# Patient Record
Sex: Male | Born: 1940 | Race: White | Hispanic: No | Marital: Single | Smoking: Never smoker
Health system: Southern US, Community
[De-identification: ages and names within clinical notes are randomized; demographics above are authoritative.]

## PROBLEM LIST (undated history)

## (undated) DIAGNOSIS — J8410 Pulmonary fibrosis, unspecified: Secondary | ICD-10-CM

## (undated) DIAGNOSIS — I10 Essential (primary) hypertension: Secondary | ICD-10-CM

---

## 2009-08-16 ENCOUNTER — Ambulatory Visit: Payer: Self-pay | Admitting: Internal Medicine

## 2010-11-01 ENCOUNTER — Ambulatory Visit: Payer: Self-pay | Admitting: Unknown Physician Specialty

## 2015-03-22 DIAGNOSIS — R011 Cardiac murmur, unspecified: Secondary | ICD-10-CM | POA: Insufficient documentation

## 2015-07-27 DIAGNOSIS — C44111 Basal cell carcinoma of skin of unspecified eyelid, including canthus: Secondary | ICD-10-CM | POA: Insufficient documentation

## 2017-03-26 DIAGNOSIS — M5431 Sciatica, right side: Secondary | ICD-10-CM | POA: Insufficient documentation

## 2018-03-29 ENCOUNTER — Other Ambulatory Visit: Payer: Self-pay | Admitting: Internal Medicine

## 2018-03-29 DIAGNOSIS — R0602 Shortness of breath: Secondary | ICD-10-CM

## 2018-04-02 ENCOUNTER — Other Ambulatory Visit: Payer: Self-pay | Admitting: Specialist

## 2018-04-02 DIAGNOSIS — R0609 Other forms of dyspnea: Principal | ICD-10-CM

## 2018-04-02 DIAGNOSIS — J986 Disorders of diaphragm: Secondary | ICD-10-CM

## 2018-04-11 ENCOUNTER — Ambulatory Visit
Admission: RE | Admit: 2018-04-11 | Discharge: 2018-04-11 | Disposition: A | Discharge status: Home / Self Care | Payer: Medicare Other | Source: Ambulatory Visit | Attending: Specialist | Admitting: Specialist

## 2018-04-11 DIAGNOSIS — R0609 Other forms of dyspnea: Secondary | ICD-10-CM | POA: Insufficient documentation

## 2018-04-11 DIAGNOSIS — J986 Disorders of diaphragm: Secondary | ICD-10-CM | POA: Insufficient documentation

## 2018-04-11 DIAGNOSIS — R918 Other nonspecific abnormal finding of lung field: Secondary | ICD-10-CM | POA: Diagnosis not present

## 2018-04-25 ENCOUNTER — Ambulatory Visit
Admission: RE | Admit: 2018-04-25 | Discharge: 2018-04-25 | Disposition: A | Discharge status: Home / Self Care | Payer: Medicare Other | Source: Ambulatory Visit | Attending: Internal Medicine | Admitting: Internal Medicine

## 2018-04-25 DIAGNOSIS — R0602 Shortness of breath: Secondary | ICD-10-CM | POA: Insufficient documentation

## 2018-04-25 LAB — POCT I-STAT CREATININE: Creatinine, Ser: 1 mg/dL (ref 0.61–1.24)

## 2018-04-25 MED ORDER — IOHEXOL 300 MG/ML  SOLN
75.0000 mL | Freq: Once | INTRAMUSCULAR | Status: AC | PRN
  Administered 2018-04-25: 75 mL via INTRAVENOUS

## 2018-05-01 DIAGNOSIS — I2510 Atherosclerotic heart disease of native coronary artery without angina pectoris: Secondary | ICD-10-CM | POA: Insufficient documentation

## 2018-05-01 DIAGNOSIS — I350 Nonrheumatic aortic (valve) stenosis: Secondary | ICD-10-CM | POA: Insufficient documentation

## 2018-06-19 DIAGNOSIS — R768 Other specified abnormal immunological findings in serum: Secondary | ICD-10-CM | POA: Insufficient documentation

## 2018-12-18 DIAGNOSIS — M159 Polyosteoarthritis, unspecified: Secondary | ICD-10-CM | POA: Insufficient documentation

## 2018-12-24 ENCOUNTER — Other Ambulatory Visit: Payer: Self-pay | Admitting: Internal Medicine

## 2018-12-24 DIAGNOSIS — R2242 Localized swelling, mass and lump, left lower limb: Secondary | ICD-10-CM

## 2019-01-01 ENCOUNTER — Ambulatory Visit
Admission: RE | Admit: 2019-01-01 | Discharge: 2019-01-01 | Disposition: A | Discharge status: Home / Self Care | Payer: Medicare Other | Source: Ambulatory Visit | Attending: Internal Medicine | Admitting: Internal Medicine

## 2019-01-01 ENCOUNTER — Other Ambulatory Visit: Payer: Self-pay

## 2019-01-01 DIAGNOSIS — R2242 Localized swelling, mass and lump, left lower limb: Secondary | ICD-10-CM | POA: Diagnosis present

## 2019-01-06 ENCOUNTER — Other Ambulatory Visit: Payer: Self-pay | Admitting: Internal Medicine

## 2019-01-06 DIAGNOSIS — R2242 Localized swelling, mass and lump, left lower limb: Secondary | ICD-10-CM

## 2019-01-15 ENCOUNTER — Ambulatory Visit
Admission: RE | Admit: 2019-01-15 | Discharge: 2019-01-15 | Disposition: A | Discharge status: Home / Self Care | Payer: Medicare Other | Source: Ambulatory Visit | Attending: Internal Medicine | Admitting: Internal Medicine

## 2019-01-15 ENCOUNTER — Other Ambulatory Visit: Payer: Self-pay

## 2019-01-15 DIAGNOSIS — R2242 Localized swelling, mass and lump, left lower limb: Secondary | ICD-10-CM | POA: Insufficient documentation

## 2019-01-15 LAB — POCT I-STAT CREATININE: Creatinine, Ser: 1.1 mg/dL (ref 0.61–1.24)

## 2019-01-15 MED ORDER — GADOBUTROL 1 MMOL/ML IV SOLN
7.5000 mL | Freq: Once | INTRAVENOUS | Status: AC | PRN
  Administered 2019-01-15: 7.5 mL via INTRAVENOUS

## 2019-04-04 ENCOUNTER — Encounter: Payer: Medicare Other | Attending: Specialist | Admitting: *Deleted

## 2019-04-04 ENCOUNTER — Other Ambulatory Visit: Payer: Self-pay

## 2019-04-04 DIAGNOSIS — J8410 Pulmonary fibrosis, unspecified: Secondary | ICD-10-CM

## 2019-04-04 NOTE — Progress Notes (Signed)
Initial telephone orientation completed. Diagnosis completed 9/23. EP orientation scheduled for 11/17 at 9:30

## 2019-04-08 ENCOUNTER — Encounter: Payer: Medicare Other | Admitting: *Deleted

## 2019-04-08 ENCOUNTER — Other Ambulatory Visit: Payer: Self-pay

## 2019-04-08 VITALS — Ht 66.0 in | Wt 159.5 lb

## 2019-04-08 DIAGNOSIS — J8410 Pulmonary fibrosis, unspecified: Secondary | ICD-10-CM | POA: Diagnosis not present

## 2019-04-08 NOTE — Progress Notes (Signed)
Pulmonary Individual Treatment Plan  Patient Details  Name: Louis Gomez MRN: [id number] Date of Birth: Mar 22, 1941 Referring Provider:     Pulmonary Rehab from 04/08/2019 in [facility] Cardiac and Pulmonary Rehab  Referring Provider  Ancil Linsey MD      Initial Encounter Date:    Pulmonary Rehab from 04/08/2019 in [facility] Cardiac and Pulmonary Rehab  Date  04/08/19      Visit Diagnosis: Pulmonary fibrosis ([place])  Patient's Home Medications on Admission:  Current Outpatient Medications:  .  aspirin EC 81 MG tablet, Take by mouth., Disp: , Rfl:  .  atorvastatin (LIPITOR) 40 MG tablet, , Disp: , Rfl:  .  azelastine (ASTELIN) 0.1 % nasal spray, Place into the nose., Disp: , Rfl:  .  Multiple Vitamin (MULTIVITAMIN) capsule, Take by mouth., Disp: , Rfl:   Past Medical History: No past medical history on file.  Tobacco Use: Social History   Tobacco Use  Smoking Status Never Smoker  Smokeless Tobacco Current User  . Types: Chew    Labs: Recent Review Flowsheet Data    There is no flowsheet data to display.       Pulmonary Assessment Scores: Pulmonary Assessment Scores    Row Name 04/08/19 1318         ADL UCSD   ADL Phase  Entry     SOB Score total  17     Rest  0     Walk  1     Stairs  2     Bath  0     Dress  1     Shop  0       CAT Score   CAT Score  6       mMRC Score   mMRC Score  2        UCSD: Self-administered rating of dyspnea associated with activities of daily living (ADLs) 6-point scale (0 = "not at all" to 5 = "maximal or unable to do because of breathlessness")  Scoring Scores range from 0 to 120.  Minimally important difference is 5 units  CAT: CAT can identify the health impairment of COPD patients and is better correlated with disease progression.  CAT has a scoring range of zero to 40. The CAT score is classified into four groups of low (less than 10), medium (10 - 20), high (21-30) and very high (31-40) based on the impact level of  disease on health status. A CAT score over 10 suggests significant symptoms.  A worsening CAT score could be explained by an exacerbation, poor medication adherence, poor inhaler technique, or progression of COPD or comorbid conditions.  CAT MCID is 2 points  mMRC: mMRC (Modified Medical Research Council) Dyspnea Scale is used to assess the degree of baseline functional disability in patients of respiratory disease due to dyspnea. No minimal important difference is established. A decrease in score of 1 point or greater is considered a positive change.   Pulmonary Function Assessment:   Exercise Target Goals: Exercise Program Goal: Individual exercise prescription set using results from initial 6 min walk test and THRR while considering  patient's activity barriers and safety.   Exercise Prescription Goal: Initial exercise prescription builds to 30-45 minutes a day of aerobic activity, 2-3 days per week.  Home exercise guidelines will be given to patient during program as part of exercise prescription that the participant will acknowledge.  Activity Barriers & Risk Stratification: Activity Barriers & Cardiac Risk Stratification - 04/08/19 1303  Activity Barriers & Cardiac Risk Stratification   Activity Barriers  Deconditioning;Muscular Weakness;Balance Concerns;Shortness of Breath       6 Minute Walk: 6 Minute Walk    Row Name 04/08/19 1301         6 Minute Walk   Phase  Initial     Distance  1135 feet     Walk Time  6 minutes     # of Rest Breaks  0     MPH  2.15     METS  2.45     RPE  14     Perceived Dyspnea   3     VO2 Peak  8.58     Symptoms  Yes (comment)     Comments  SOB     Resting HR  63 bpm     Resting BP  122/64     Resting Oxygen Saturation   94 %     Exercise Oxygen Saturation  during 6 min walk  88 %     Max Ex. HR  103 bpm     Max Ex. BP  146/74     2 Minute Post BP  134/70       Interval HR   1 Minute HR  87     2 Minute HR  99     3 Minute  HR  77     4 Minute HR  62     5 Minute HR  63     6 Minute HR  103     2 Minute Post HR  67     Interval Heart Rate?  Yes       Interval Oxygen   Interval Oxygen?  Yes     Baseline Oxygen Saturation %  94 %     1 Minute Oxygen Saturation %  93 %     1 Minute Liters of Oxygen  0 L Room Air     2 Minute Oxygen Saturation %  92 %     2 Minute Liters of Oxygen  0 L     3 Minute Oxygen Saturation %  91 %     3 Minute Liters of Oxygen  0 L     4 Minute Oxygen Saturation %  97 %     4 Minute Liters of Oxygen  0 L     5 Minute Oxygen Saturation %  89 %     5 Minute Liters of Oxygen  0 L     6 Minute Oxygen Saturation %  88 %     6 Minute Liters of Oxygen  0 L     2 Minute Post Oxygen Saturation %  94 %     2 Minute Post Liters of Oxygen  0 L       Oxygen Initial Assessment: Oxygen Initial Assessment - 04/08/19 1317      Home Oxygen   Home Oxygen Device  None    Sleep Oxygen Prescription  None    Home Exercise Oxygen Prescription  None    Home at Rest Exercise Oxygen Prescription  None      Initial 6 min Walk   Oxygen Used  None      Program Oxygen Prescription   Program Oxygen Prescription  None      Intervention   Short Term Goals  To learn and understand importance of maintaining oxygen saturations>88%;To learn and demonstrate proper pursed lip breathing techniques or other breathing techniques.;To  learn and understand importance of monitoring SPO2 with pulse oximeter and demonstrate accurate use of the pulse oximeter.    Long  Term Goals  Maintenance of O2 saturations>88%;Verbalizes importance of monitoring SPO2 with pulse oximeter and return demonstration;Exhibits proper breathing techniques, such as pursed lip breathing or other method taught during program session       Oxygen Re-Evaluation:   Oxygen Discharge (Final Oxygen Re-Evaluation):   Initial Exercise Prescription: Initial Exercise Prescription - 04/08/19 1300      Date of Initial Exercise RX and  Referring Provider   Date  04/08/19    Referring Provider  Ancil Linsey MD      Treadmill   MPH  1.9    Grade  0.5    Minutes  15    METs  2.59      Recumbant Bike   Level  1    RPM  50    Watts  10    Minutes  15    METs  2.5      NuStep   Level  1    SPM  80    Minutes  15    METs  2.5      Recumbant Elliptical   Level  1    RPM  50    Minutes  15    METs  2.5      Prescription Details   Frequency (times per week)  3    Duration  Progress to 30 minutes of continuous aerobic without signs/symptoms of physical distress      Intensity   THRR 40-80% of Max Heartrate  95-127    Ratings of Perceived Exertion  11-13    Perceived Dyspnea  0-4      Progression   Progression  Continue to progress workloads to maintain intensity without signs/symptoms of physical distress.      Resistance Training   Training Prescription  Yes    Weight  3 lb    Reps  10-15       Perform Capillary Blood Glucose checks as needed.  Exercise Prescription Changes: Exercise Prescription Changes    Row Name 04/08/19 1300             Response to Exercise   Blood Pressure (Admit)  122/64       Blood Pressure (Exercise)  142/64       Blood Pressure (Exit)  134/70       Heart Rate (Admit)  63 bpm       Heart Rate (Exercise)  103 bpm       Heart Rate (Exit)  64 bpm       Oxygen Saturation (Admit)  94 %       Oxygen Saturation (Exercise)  88 %       Oxygen Saturation (Exit)  94 %       Rating of Perceived Exertion (Exercise)  14       Perceived Dyspnea (Exercise)  3       Symptoms  SOB       Comments  walk test results          Exercise Comments:   Exercise Goals and Review: Exercise Goals    Row Name 04/08/19 1312             Exercise Goals   Increase Physical Activity  Yes       Intervention  Provide advice, education, support and counseling about physical activity/exercise needs.;Develop an individualized exercise prescription  for aerobic and resistive training  based on initial evaluation findings, risk stratification, comorbidities and participant's personal goals.       Expected Outcomes  Short Term: Attend rehab on a regular basis to increase amount of physical activity.;Long Term: Exercising regularly at least 3-5 days a week.;Long Term: Add in home exercise to make exercise part of routine and to increase amount of physical activity.       Increase Strength and Stamina  Yes       Intervention  Provide advice, education, support and counseling about physical activity/exercise needs.;Develop an individualized exercise prescription for aerobic and resistive training based on initial evaluation findings, risk stratification, comorbidities and participant's personal goals.       Expected Outcomes  Short Term: Increase workloads from initial exercise prescription for resistance, speed, and METs.;Short Term: Perform resistance training exercises routinely during rehab and add in resistance training at home;Long Term: Improve cardiorespiratory fitness, muscular endurance and strength as measured by increased METs and functional capacity (6MWT)       Able to understand and use rate of perceived exertion (RPE) scale  Yes       Intervention  Provide education and explanation on how to use RPE scale       Expected Outcomes  Short Term: Able to use RPE daily in rehab to express subjective intensity level;Long Term:  Able to use RPE to guide intensity level when exercising independently       Able to understand and use Dyspnea scale  Yes       Intervention  Provide education and explanation on how to use Dyspnea scale       Expected Outcomes  Short Term: Able to use Dyspnea scale daily in rehab to express subjective sense of shortness of breath during exertion;Long Term: Able to use Dyspnea scale to guide intensity level when exercising independently       Knowledge and understanding of Target Heart Rate Range (THRR)  Yes       Intervention  Provide education and  explanation of THRR including how the numbers were predicted and where they are located for reference       Expected Outcomes  Short Term: Able to state/look up THRR;Long Term: Able to use THRR to govern intensity when exercising independently;Short Term: Able to use daily as guideline for intensity in rehab       Able to check pulse independently  Yes       Intervention  Provide education and demonstration on how to check pulse in carotid and radial arteries.;Review the importance of being able to check your own pulse for safety during independent exercise       Expected Outcomes  Short Term: Able to explain why pulse checking is important during independent exercise;Long Term: Able to check pulse independently and accurately       Understanding of Exercise Prescription  Yes       Intervention  Provide education, explanation, and written materials on patient's individual exercise prescription       Expected Outcomes  Short Term: Able to explain program exercise prescription;Long Term: Able to explain home exercise prescription to exercise independently          Exercise Goals Re-Evaluation :   Discharge Exercise Prescription (Final Exercise Prescription Changes): Exercise Prescription Changes - 04/08/19 1300      Response to Exercise   Blood Pressure (Admit)  122/64    Blood Pressure (Exercise)  142/64    Blood Pressure (Exit)  134/70    Heart Rate (Admit)  63 bpm    Heart Rate (Exercise)  103 bpm    Heart Rate (Exit)  64 bpm    Oxygen Saturation (Admit)  94 %    Oxygen Saturation (Exercise)  88 %    Oxygen Saturation (Exit)  94 %    Rating of Perceived Exertion (Exercise)  14    Perceived Dyspnea (Exercise)  3    Symptoms  SOB    Comments  walk test results       Nutrition:  Target Goals: Understanding of nutrition guidelines, daily intake of sodium '1500mg'$ , cholesterol '200mg'$ , calories 30% from fat and 7% or less from saturated fats, daily to have 5 or more servings of fruits  and vegetables.  Biometrics: Pre Biometrics - 04/08/19 1313      Pre Biometrics   Height  '5\' 6"'$  (1.676 m)    Weight  159 lb 8 oz (72.3 kg)    BMI (Calculated)  25.76    Single Leg Stand  1.63 seconds        Nutrition Therapy Plan and Nutrition Goals:   Nutrition Assessments: Nutrition Assessments - 04/08/19 1138      MEDFICTS Scores   Pre Score  95       Nutrition Goals Re-Evaluation:   Nutrition Goals Discharge (Final Nutrition Goals Re-Evaluation):   Psychosocial: Target Goals: Acknowledge presence or absence of significant depression and/or stress, maximize coping skills, provide positive support system. Participant is able to verbalize types and ability to use techniques and skills needed for reducing stress and depression.   Initial Review & Psychosocial Screening: Initial Psych Review & Screening - 04/04/19 1308      Initial Review   Current issues with  Current Stress Concerns    Source of Stress Concerns  Financial      Family Dynamics   Good Support System?  Yes      Barriers   Psychosocial barriers to participate in program  There are no identifiable barriers or psychosocial needs.      Screening Interventions   Interventions  Encouraged to exercise;To provide support and resources with identified psychosocial needs;Provide feedback about the scores to participant    Expected Outcomes  Short Term goal: Utilizing psychosocial counselor, staff and physician to assist with identification of specific Stressors or current issues interfering with healing process. Setting desired goal for each stressor or current issue identified.;Long Term Goal: Stressors or current issues are controlled or eliminated.;Short Term goal: Identification and review with participant of any Quality of Life or Depression concerns found by scoring the questionnaire.;Long Term goal: The participant improves quality of Life and PHQ9 Scores as seen by post scores and/or verbalization of  changes       Quality of Life Scores:  Scores of 19 and below usually indicate a poorer quality of life in these areas.  A difference of  2-3 points is a clinically meaningful difference.  A difference of 2-3 points in the total score of the Quality of Life Index has been associated with significant improvement in overall quality of life, self-image, physical symptoms, and general health in studies assessing change in quality of life.  PHQ-9: Recent Review Flowsheet Data    Depression screen [facility] 2/9 04/08/2019   Decreased Interest 0   Down, Depressed, Hopeless 0   PHQ - 2 Score 0   Altered sleeping 0   Tired, decreased energy 1   Change in appetite 1   Feeling bad  or failure about yourself  0   Trouble concentrating 0   Moving slowly or fidgety/restless 0   Suicidal thoughts 0   PHQ-9 Score 2   Difficult doing work/chores Not difficult at all     Interpretation of Total Score  Total Score Depression Severity:  1-4 = Minimal depression, 5-9 = Mild depression, 10-14 = Moderate depression, 15-19 = Moderately severe depression, 20-27 = Severe depression   Psychosocial Evaluation and Intervention: Psychosocial Evaluation - 04/04/19 1355      Psychosocial Evaluation & Interventions   Interventions  Stress management education;Encouraged to exercise with the program and follow exercise prescription    Comments  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 He hopes this program helps some with his stamina. He does have someone that helps out with some of the farm duties, but paying him is difficult sometimes. His girlfriend is very supportive and his son lives down the road from him if he needs anything    Expected Outcomes  Short: attend [facility] to increase stamina Long: develop self care habits.    Continue Psychosocial Services   Follow up required by staff       Psychosocial Re-Evaluation:   Psychosocial Discharge (Final  Psychosocial Re-Evaluation):   Education: Education Goals: Education classes will be provided on a weekly basis, covering required topics. Participant will state understanding/return demonstration of topics presented.  Learning Barriers/Preferences: Learning Barriers/Preferences - 04/04/19 1308      Learning Barriers/Preferences   Learning Barriers  None    Learning Preferences  None       Education Topics:  Initial Evaluation Education: - Verbal, written and demonstration of respiratory meds, oximetry and breathing techniques. Instruction on use of nebulizers and MDIs and importance of monitoring MDI activations.   Pulmonary Rehab from 04/08/2019 in [facility] Cardiac and Pulmonary Rehab  Date  04/08/19  Educator  [facility]  Instruction Review Code  1- Verbalizes Understanding      General Nutrition Guidelines/Fats and Fiber: -Group instruction provided by verbal, written material, models and posters to present the general guidelines for heart healthy nutrition. Gives an explanation and review of dietary fats and fiber.   Controlling Sodium/Reading Food Labels: -Group verbal and written material supporting the discussion of sodium use in heart healthy nutrition. Review and explanation with models, verbal and written materials for utilization of the food label.   Exercise Physiology & General Exercise Guidelines: - Group verbal and written instruction with models to review the exercise physiology of the cardiovascular system and associated critical values. Provides general exercise guidelines with specific guidelines to those with heart or lung disease.    Aerobic Exercise & Resistance Training: - Gives group verbal and written instruction on the various components of exercise. Focuses on aerobic and resistive training programs and the benefits of this training and how to safely progress through these programs.   Flexibility, Balance, Mind/Body Relaxation: Provides group verbal/written  instruction on the benefits of flexibility and balance training, including mind/body exercise modes such as yoga, pilates and tai chi.  Demonstration and skill practice provided.   Stress and Anxiety: - Provides group verbal and written instruction about the health risks of elevated stress and causes of high stress.  Discuss the correlation between heart/lung disease and anxiety and treatment options. Review healthy ways to manage with stress and anxiety.   Depression: - Provides group verbal and written instruction on the correlation between heart/lung  disease and depressed mood, treatment options, and the stigmas associated with seeking treatment.   Exercise & Equipment Safety: - Individual verbal instruction and demonstration of equipment use and safety with use of the equipment.   Pulmonary Rehab from 04/08/2019 in [facility] Cardiac and Pulmonary Rehab  Date  04/08/19  Educator  [facility]  Instruction Review Code  1- Verbalizes Understanding      Infection Prevention: - Provides verbal and written material to individual with discussion of infection control including proper hand washing and proper equipment cleaning during exercise session.   Pulmonary Rehab from 04/08/2019 in [facility] Cardiac and Pulmonary Rehab  Date  04/08/19  Educator  [facility]  Instruction Review Code  1- Verbalizes Understanding      Falls Prevention: - Provides verbal and written material to individual with discussion of falls prevention and safety.   Pulmonary Rehab from 04/08/2019 in [facility] Cardiac and Pulmonary Rehab  Date  04/08/19  Educator  [facility]  Instruction Review Code  1- Verbalizes Understanding      Diabetes: - Individual verbal and written instruction to review signs/symptoms of diabetes, desired ranges of glucose level fasting, after meals and with exercise. Advice that pre and post exercise glucose checks will be done for 3 sessions at entry of program.   Chronic Lung Diseases: - Group verbal and written  instruction to review updates, respiratory medications, advancements in procedures and treatments. Discuss use of supplemental oxygen including available portable oxygen systems, continuous and intermittent flow rates, concentrators, personal use and safety guidelines. Review proper use of inhaler and spacers. Provide informative websites for self-education.    Energy Conservation: - Provide group verbal and written instruction for methods to conserve energy, plan and organize activities. Instruct on pacing techniques, use of adaptive equipment and posture/positioning to relieve shortness of breath.   Triggers and Exacerbations: - Group verbal and written instruction to review types of environmental triggers and ways to prevent exacerbations. Discuss weather changes, air quality and the benefits of nasal washing. Review warning signs and symptoms to help prevent infections. Discuss techniques for effective airway clearance, coughing, and vibrations.   AED/CPR: - Group verbal and written instruction with the use of models to demonstrate the basic use of the AED with the basic ABC's of resuscitation.   Anatomy and Physiology of the Lungs: - Group verbal and written instruction with the use of models to provide basic lung anatomy and physiology related to function, structure and complications of lung disease.   Anatomy & Physiology of the Heart: - Group verbal and written instruction and models provide basic cardiac anatomy and physiology, with the coronary electrical and arterial systems. Review of Valvular disease and Heart Failure   Cardiac Medications: - Group verbal and written instruction to review commonly prescribed medications for heart disease. Reviews the medication, class of the drug, and side effects.   Know Your Numbers and Risk Factors: -Group verbal and written instruction about important numbers in your health.  Discussion of what are risk factors and how they play a role in  the disease process.  Review of Cholesterol, Blood Pressure, Diabetes, and BMI and the role they play in your overall health.   Sleep Hygiene: -Provides group verbal and written instruction about how sleep can affect your health.  Define sleep hygiene, discuss sleep cycles and impact of sleep habits. Review good sleep hygiene tips.    Other: -Provides group and verbal instruction on various topics (see comments)    Knowledge Questionnaire Score: Knowledge Questionnaire Score -  04/08/19 1314      Knowledge Questionnaire Score   Pre Score  13/16 Education Focus: Exercise, PLB, oxygen safety        Core Components/Risk Factors/Patient Goals at Admission: Personal Goals and Risk Factors at Admission - 04/08/19 1315      Core Components/Risk Factors/Patient Goals on Admission    Weight Management  Yes;Weight Loss    Intervention  Weight Management: Develop a combined nutrition and exercise program designed to reach desired caloric intake, while maintaining appropriate intake of nutrient and fiber, sodium and fats, and appropriate energy expenditure required for the weight goal.;Weight Management: Provide education and appropriate resources to help participant work on and attain dietary goals.    Admit Weight  159 lb 8 oz (72.3 kg)    Goal Weight: Short Term  154 lb (69.9 kg)    Goal Weight: Long Term  150 lb (68 kg)    Expected Outcomes  Short Term: Continue to assess and modify interventions until short term weight is achieved;Long Term: Adherence to nutrition and physical activity/exercise program aimed toward attainment of established weight goal;Weight Loss: Understanding of general recommendations for a balanced deficit meal plan, which promotes 1-2 lb weight loss per week and includes a negative energy balance of [phone] kcal/d;Understanding recommendations for meals to include 15-35% energy as protein, 25-35% energy from fat, 35-60% energy from carbohydrates, less than '200mg'$  of  dietary cholesterol, 20-35 gm of total fiber daily;Understanding of distribution of calorie intake throughout the day with the consumption of 4-5 meals/snacks    Improve shortness of breath with ADL's  Yes    Intervention  Provide education, individualized exercise plan and daily activity instruction to help decrease symptoms of SOB with activities of daily living.    Expected Outcomes  Short Term: Improve cardiorespiratory fitness to achieve a reduction of symptoms when performing ADLs;Long Term: Be able to perform more ADLs without symptoms or delay the onset of symptoms    Lipids  Yes    Intervention  Provide education and support for participant on nutrition & aerobic/resistive exercise along with prescribed medications to achieve LDL '70mg'$ , HDL >'40mg'$ .    Expected Outcomes  Short Term: Participant states understanding of desired cholesterol values and is compliant with medications prescribed. Participant is following exercise prescription and nutrition guidelines.;Long Term: Cholesterol controlled with medications as prescribed, with individualized exercise RX and with personalized nutrition plan. Value goals: LDL < '70mg'$ , HDL > 40 mg.       Core Components/Risk Factors/Patient Goals Review:    Core Components/Risk Factors/Patient Goals at Discharge (Final Review):    ITP Comments: ITP Comments    Row Name 04/04/19 1321 04/08/19 1258         ITP Comments  Initial telephone orientation completed. Diagnosis completed 9/23. EP orientation scheduled for 11/17 at 9:30  Completed 6MWT and gym orientation.  Initial ITP created and sent for review to Dr. Emily Filbert, Medical Director.         Comments: Initial ITP

## 2019-04-08 NOTE — Patient Instructions (Signed)
Patient Instructions  Patient Details  Name: Louis Gomez MRN: [id number] Date of Birth: Nov 10, 1940 Referring Provider:  Erby Pian, MD  Below are your personal goals for exercise, nutrition, and risk factors. Our goal is to help you stay on track towards obtaining and maintaining these goals. We will be discussing your progress on these goals with you throughout the program.  Initial Exercise Prescription: Initial Exercise Prescription - 04/08/19 1300      Date of Initial Exercise RX and Referring Provider   Date  04/08/19    Referring Provider  Ancil Linsey MD      Treadmill   MPH  1.9    Grade  0.5    Minutes  15    METs  2.59      Recumbant Bike   Level  1    RPM  50    Watts  10    Minutes  15    METs  2.5      NuStep   Level  1    SPM  80    Minutes  15    METs  2.5      Recumbant Elliptical   Level  1    RPM  50    Minutes  15    METs  2.5      Prescription Details   Frequency (times per week)  3    Duration  Progress to 30 minutes of continuous aerobic without signs/symptoms of physical distress      Intensity   THRR 40-80% of Max Heartrate  95-127    Ratings of Perceived Exertion  11-13    Perceived Dyspnea  0-4      Progression   Progression  Continue to progress workloads to maintain intensity without signs/symptoms of physical distress.      Resistance Training   Training Prescription  Yes    Weight  3 lb    Reps  10-15       Exercise Goals: Frequency: Be able to perform aerobic exercise two to three times per week in program working toward 2-5 days per week of home exercise.  Intensity: Work with a perceived exertion of 11 (fairly light) - 15 (hard) while following your exercise prescription.  We will make changes to your prescription with you as you progress through the program.   Duration: Be able to do 30 to 45 minutes of continuous aerobic exercise in addition to a 5 minute warm-up and a 5 minute cool-down routine.    Nutrition Goals: Your personal nutrition goals will be established when you do your nutrition analysis with the dietician.  The following are general nutrition guidelines to follow: Cholesterol < 200mg /day Sodium < 1500mg /day Fiber: Men over 50 yrs - 30 grams per day  Personal Goals: Personal Goals and Risk Factors at Admission - 04/08/19 1315      Core Components/Risk Factors/Patient Goals on Admission    Weight Management  Yes;Weight Loss    Intervention  Weight Management: Develop a combined nutrition and exercise program designed to reach desired caloric intake, while maintaining appropriate intake of nutrient and fiber, sodium and fats, and appropriate energy expenditure required for the weight goal.;Weight Management: Provide education and appropriate resources to help participant work on and attain dietary goals.    Admit Weight  159 lb 8 oz (72.3 kg)    Goal Weight: Short Term  154 lb (69.9 kg)    Goal Weight: Long Term  150 lb (68 kg)  Expected Outcomes  Short Term: Continue to assess and modify interventions until short term weight is achieved;Long Term: Adherence to nutrition and physical activity/exercise program aimed toward attainment of established weight goal;Weight Loss: Understanding of general recommendations for a balanced deficit meal plan, which promotes 1-2 lb weight loss per week and includes a negative energy balance of [phone] kcal/d;Understanding recommendations for meals to include 15-35% energy as protein, 25-35% energy from fat, 35-60% energy from carbohydrates, less than 200mg  of dietary cholesterol, 20-35 gm of total fiber daily;Understanding of distribution of calorie intake throughout the day with the consumption of 4-5 meals/snacks    Improve shortness of breath with ADL's  Yes    Intervention  Provide education, individualized exercise plan and daily activity instruction to help decrease symptoms of SOB with activities of daily living.    Expected Outcomes   Short Term: Improve cardiorespiratory fitness to achieve a reduction of symptoms when performing ADLs;Long Term: Be able to perform more ADLs without symptoms or delay the onset of symptoms    Lipids  Yes    Intervention  Provide education and support for participant on nutrition & aerobic/resistive exercise along with prescribed medications to achieve LDL 70mg , HDL >40mg .    Expected Outcomes  Short Term: Participant states understanding of desired cholesterol values and is compliant with medications prescribed. Participant is following exercise prescription and nutrition guidelines.;Long Term: Cholesterol controlled with medications as prescribed, with individualized exercise RX and with personalized nutrition plan. Value goals: LDL < 70mg , HDL > 40 mg.       Tobacco Use Initial Evaluation: Social History   Tobacco Use  Smoking Status Never Smoker  Smokeless Tobacco Current User  . Types: Chew    Exercise Goals and Review: Exercise Goals    Row Name 04/08/19 1312             Exercise Goals   Increase Physical Activity  Yes       Intervention  Provide advice, education, support and counseling about physical activity/exercise needs.;Develop an individualized exercise prescription for aerobic and resistive training based on initial evaluation findings, risk stratification, comorbidities and participant's personal goals.       Expected Outcomes  Short Term: Attend rehab on a regular basis to increase amount of physical activity.;Long Term: Exercising regularly at least 3-5 days a week.;Long Term: Add in home exercise to make exercise part of routine and to increase amount of physical activity.       Increase Strength and Stamina  Yes       Intervention  Provide advice, education, support and counseling about physical activity/exercise needs.;Develop an individualized exercise prescription for aerobic and resistive training based on initial evaluation findings, risk stratification,  comorbidities and participant's personal goals.       Expected Outcomes  Short Term: Increase workloads from initial exercise prescription for resistance, speed, and METs.;Short Term: Perform resistance training exercises routinely during rehab and add in resistance training at home;Long Term: Improve cardiorespiratory fitness, muscular endurance and strength as measured by increased METs and functional capacity (6MWT)       Able to understand and use rate of perceived exertion (RPE) scale  Yes       Intervention  Provide education and explanation on how to use RPE scale       Expected Outcomes  Short Term: Able to use RPE daily in rehab to express subjective intensity level;Long Term:  Able to use RPE to guide intensity level when exercising independently  Able to understand and use Dyspnea scale  Yes       Intervention  Provide education and explanation on how to use Dyspnea scale       Expected Outcomes  Short Term: Able to use Dyspnea scale daily in rehab to express subjective sense of shortness of breath during exertion;Long Term: Able to use Dyspnea scale to guide intensity level when exercising independently       Knowledge and understanding of Target Heart Rate Range (THRR)  Yes       Intervention  Provide education and explanation of THRR including how the numbers were predicted and where they are located for reference       Expected Outcomes  Short Term: Able to state/look up THRR;Long Term: Able to use THRR to govern intensity when exercising independently;Short Term: Able to use daily as guideline for intensity in rehab       Able to check pulse independently  Yes       Intervention  Provide education and demonstration on how to check pulse in carotid and radial arteries.;Review the importance of being able to check your own pulse for safety during independent exercise       Expected Outcomes  Short Term: Able to explain why pulse checking is important during independent exercise;Long  Term: Able to check pulse independently and accurately       Understanding of Exercise Prescription  Yes       Intervention  Provide education, explanation, and written materials on patient's individual exercise prescription       Expected Outcomes  Short Term: Able to explain program exercise prescription;Long Term: Able to explain home exercise prescription to exercise independently          Copy of goals given to participant.

## 2019-04-08 NOTE — Progress Notes (Signed)
Cardiac Individual Treatment Plan  Patient Details  Name: Louis Gomez MRN: [id number] Date of Birth: 12-03-1940 Referring Provider:     Pulmonary Rehab from 04/08/2019 in [facility] Cardiac and Pulmonary Rehab  Referring Provider  Ancil Linsey MD      Initial Encounter Date:    Pulmonary Rehab from 04/08/2019 in [facility] Cardiac and Pulmonary Rehab  Date  04/08/19      Visit Diagnosis: Pulmonary fibrosis ([place])  Patient's Home Medications on Admission:  Current Outpatient Medications:  .  aspirin EC 81 MG tablet, Take by mouth., Disp: , Rfl:  .  atorvastatin (LIPITOR) 40 MG tablet, , Disp: , Rfl:  .  azelastine (ASTELIN) 0.1 % nasal spray, Place into the nose., Disp: , Rfl:  .  Multiple Vitamin (MULTIVITAMIN) capsule, Take by mouth., Disp: , Rfl:   Past Medical History: No past medical history on file.  Tobacco Use: Social History   Tobacco Use  Smoking Status Never Smoker  Smokeless Tobacco Current User  . Types: Chew    Labs: Recent Review Flowsheet Data    There is no flowsheet data to display.       Exercise Target Goals: Exercise Program Goal: Individual exercise prescription set using results from initial 6 min walk test and THRR while considering  patient's activity barriers and safety.   Exercise Prescription Goal: Initial exercise prescription builds to 30-45 minutes a day of aerobic activity, 2-3 days per week.  Home exercise guidelines will be given to patient during program as part of exercise prescription that the participant will acknowledge.  Activity Barriers & Risk Stratification: Activity Barriers & Cardiac Risk Stratification - 04/08/19 1303      Activity Barriers & Cardiac Risk Stratification   Activity Barriers  Deconditioning;Muscular Weakness;Balance Concerns;Shortness of Breath       6 Minute Walk: 6 Minute Walk    Row Name 04/08/19 1301         6 Minute Walk   Phase  Initial     Distance  1135 feet     Walk Time  6 minutes      # of Rest Breaks  0     MPH  2.15     METS  2.45     RPE  14     Perceived Dyspnea   3     VO2 Peak  8.58     Symptoms  Yes (comment)     Comments  SOB     Resting HR  63 bpm     Resting BP  122/64     Resting Oxygen Saturation   94 %     Exercise Oxygen Saturation  during 6 min walk  88 %     Max Ex. HR  103 bpm     Max Ex. BP  146/74     2 Minute Post BP  134/70       Interval HR   1 Minute HR  87     2 Minute HR  99     3 Minute HR  77     4 Minute HR  62     5 Minute HR  63     6 Minute HR  103     2 Minute Post HR  67     Interval Heart Rate?  Yes       Interval Oxygen   Interval Oxygen?  Yes     Baseline Oxygen Saturation %  94 %  1 Minute Oxygen Saturation %  93 %     1 Minute Liters of Oxygen  0 L Room Air     2 Minute Oxygen Saturation %  92 %     2 Minute Liters of Oxygen  0 L     3 Minute Oxygen Saturation %  91 %     3 Minute Liters of Oxygen  0 L     4 Minute Oxygen Saturation %  97 %     4 Minute Liters of Oxygen  0 L     5 Minute Oxygen Saturation %  89 %     5 Minute Liters of Oxygen  0 L     6 Minute Oxygen Saturation %  88 %     6 Minute Liters of Oxygen  0 L     2 Minute Post Oxygen Saturation %  94 %     2 Minute Post Liters of Oxygen  0 L        Oxygen Initial Assessment: Oxygen Initial Assessment - 04/08/19 1317      Home Oxygen   Home Oxygen Device  None    Sleep Oxygen Prescription  None    Home Exercise Oxygen Prescription  None    Home at Rest Exercise Oxygen Prescription  None      Initial 6 min Walk   Oxygen Used  None      Program Oxygen Prescription   Program Oxygen Prescription  None      Intervention   Short Term Goals  To learn and understand importance of maintaining oxygen saturations>88%;To learn and demonstrate proper pursed lip breathing techniques or other breathing techniques.;To learn and understand importance of monitoring SPO2 with pulse oximeter and demonstrate accurate use of the pulse oximeter.    Long   Term Goals  Maintenance of O2 saturations>88%;Verbalizes importance of monitoring SPO2 with pulse oximeter and return demonstration;Exhibits proper breathing techniques, such as pursed lip breathing or other method taught during program session       Oxygen Re-Evaluation:   Oxygen Discharge (Final Oxygen Re-Evaluation):   Initial Exercise Prescription: Initial Exercise Prescription - 04/08/19 1300      Date of Initial Exercise RX and Referring Provider   Date  04/08/19    Referring Provider  Ancil Linsey MD      Treadmill   MPH  1.9    Grade  0.5    Minutes  15    METs  2.59      Recumbant Bike   Level  1    RPM  50    Watts  10    Minutes  15    METs  2.5      NuStep   Level  1    SPM  80    Minutes  15    METs  2.5      Recumbant Elliptical   Level  1    RPM  50    Minutes  15    METs  2.5      Prescription Details   Frequency (times per week)  3    Duration  Progress to 30 minutes of continuous aerobic without signs/symptoms of physical distress      Intensity   THRR 40-80% of Max Heartrate  95-127    Ratings of Perceived Exertion  11-13    Perceived Dyspnea  0-4      Progression   Progression  Continue to progress workloads to  maintain intensity without signs/symptoms of physical distress.      Resistance Training   Training Prescription  Yes    Weight  3 lb    Reps  10-15       Perform Capillary Blood Glucose checks as needed.  Exercise Prescription Changes: Exercise Prescription Changes    Row Name 04/08/19 1300             Response to Exercise   Blood Pressure (Admit)  122/64       Blood Pressure (Exercise)  142/64       Blood Pressure (Exit)  134/70       Heart Rate (Admit)  63 bpm       Heart Rate (Exercise)  103 bpm       Heart Rate (Exit)  64 bpm       Oxygen Saturation (Admit)  94 %       Oxygen Saturation (Exercise)  88 %       Oxygen Saturation (Exit)  94 %       Rating of Perceived Exertion (Exercise)  14        Perceived Dyspnea (Exercise)  3       Symptoms  SOB       Comments  walk test results          Exercise Comments:   Exercise Goals and Review: Exercise Goals    Row Name 04/08/19 1312             Exercise Goals   Increase Physical Activity  Yes       Intervention  Provide advice, education, support and counseling about physical activity/exercise needs.;Develop an individualized exercise prescription for aerobic and resistive training based on initial evaluation findings, risk stratification, comorbidities and participant's personal goals.       Expected Outcomes  Short Term: Attend rehab on a regular basis to increase amount of physical activity.;Long Term: Exercising regularly at least 3-5 days a week.;Long Term: Add in home exercise to make exercise part of routine and to increase amount of physical activity.       Increase Strength and Stamina  Yes       Intervention  Provide advice, education, support and counseling about physical activity/exercise needs.;Develop an individualized exercise prescription for aerobic and resistive training based on initial evaluation findings, risk stratification, comorbidities and participant's personal goals.       Expected Outcomes  Short Term: Increase workloads from initial exercise prescription for resistance, speed, and METs.;Short Term: Perform resistance training exercises routinely during rehab and add in resistance training at home;Long Term: Improve cardiorespiratory fitness, muscular endurance and strength as measured by increased METs and functional capacity (6MWT)       Able to understand and use rate of perceived exertion (RPE) scale  Yes       Intervention  Provide education and explanation on how to use RPE scale       Expected Outcomes  Short Term: Able to use RPE daily in rehab to express subjective intensity level;Long Term:  Able to use RPE to guide intensity level when exercising independently       Able to understand and use  Dyspnea scale  Yes       Intervention  Provide education and explanation on how to use Dyspnea scale       Expected Outcomes  Short Term: Able to use Dyspnea scale daily in rehab to express subjective sense of shortness of breath during exertion;Long Term: Able  to use Dyspnea scale to guide intensity level when exercising independently       Knowledge and understanding of Target Heart Rate Range (THRR)  Yes       Intervention  Provide education and explanation of THRR including how the numbers were predicted and where they are located for reference       Expected Outcomes  Short Term: Able to state/look up THRR;Long Term: Able to use THRR to govern intensity when exercising independently;Short Term: Able to use daily as guideline for intensity in rehab       Able to check pulse independently  Yes       Intervention  Provide education and demonstration on how to check pulse in carotid and radial arteries.;Review the importance of being able to check your own pulse for safety during independent exercise       Expected Outcomes  Short Term: Able to explain why pulse checking is important during independent exercise;Long Term: Able to check pulse independently and accurately       Understanding of Exercise Prescription  Yes       Intervention  Provide education, explanation, and written materials on patient's individual exercise prescription       Expected Outcomes  Short Term: Able to explain program exercise prescription;Long Term: Able to explain home exercise prescription to exercise independently          Exercise Goals Re-Evaluation :   Discharge Exercise Prescription (Final Exercise Prescription Changes): Exercise Prescription Changes - 04/08/19 1300      Response to Exercise   Blood Pressure (Admit)  122/64    Blood Pressure (Exercise)  142/64    Blood Pressure (Exit)  134/70    Heart Rate (Admit)  63 bpm    Heart Rate (Exercise)  103 bpm    Heart Rate (Exit)  64 bpm    Oxygen  Saturation (Admit)  94 %    Oxygen Saturation (Exercise)  88 %    Oxygen Saturation (Exit)  94 %    Rating of Perceived Exertion (Exercise)  14    Perceived Dyspnea (Exercise)  3    Symptoms  SOB    Comments  walk test results       Nutrition:  Target Goals: Understanding of nutrition guidelines, daily intake of sodium '1500mg'$ , cholesterol '200mg'$ , calories 30% from fat and 7% or less from saturated fats, daily to have 5 or more servings of fruits and vegetables.  Biometrics: Pre Biometrics - 04/08/19 1313      Pre Biometrics   Height  '5\' 6"'$  (1.676 m)    Weight  159 lb 8 oz (72.3 kg)    BMI (Calculated)  25.76    Single Leg Stand  1.63 seconds        Nutrition Therapy Plan and Nutrition Goals:   Nutrition Assessments: Nutrition Assessments - 04/08/19 1138      MEDFICTS Scores   Pre Score  95       Nutrition Goals Re-Evaluation:   Nutrition Goals Discharge (Final Nutrition Goals Re-Evaluation):   Psychosocial: Target Goals: Acknowledge presence or absence of significant depression and/or stress, maximize coping skills, provide positive support system. Participant is able to verbalize types and ability to use techniques and skills needed for reducing stress and depression.   Initial Review & Psychosocial Screening: Initial Psych Review & Screening - 04/04/19 1308      Initial Review   Current issues with  Current Stress Concerns    Source of Stress Concerns  Financial      Family Dynamics   Good Support System?  Yes      Barriers   Psychosocial barriers to participate in program  There are no identifiable barriers or psychosocial needs.      Screening Interventions   Interventions  Encouraged to exercise;To provide support and resources with identified psychosocial needs;Provide feedback about the scores to participant    Expected Outcomes  Short Term goal: Utilizing psychosocial counselor, staff and physician to assist with identification of specific  Stressors or current issues interfering with healing process. Setting desired goal for each stressor or current issue identified.;Long Term Goal: Stressors or current issues are controlled or eliminated.;Short Term goal: Identification and review with participant of any Quality of Life or Depression concerns found by scoring the questionnaire.;Long Term goal: The participant improves quality of Life and PHQ9 Scores as seen by post scores and/or verbalization of changes       Quality of Life Scores:   Scores of 19 and below usually indicate a poorer quality of life in these areas.  A difference of  2-3 points is a clinically meaningful difference.  A difference of 2-3 points in the total score of the Quality of Life Index has been associated with significant improvement in overall quality of life, self-image, physical symptoms, and general health in studies assessing change in quality of life.  PHQ-9: Recent Review Flowsheet Data    Depression screen [facility] 2/9 04/08/2019   Decreased Interest 0   Down, Depressed, Hopeless 0   PHQ - 2 Score 0   Altered sleeping 0   Tired, decreased energy 1   Change in appetite 1   Feeling bad or failure about yourself  0   Trouble concentrating 0   Moving slowly or fidgety/restless 0   Suicidal thoughts 0   PHQ-9 Score 2   Difficult doing work/chores Not difficult at all     Interpretation of Total Score  Total Score Depression Severity:  1-4 = Minimal depression, 5-9 = Mild depression, 10-14 = Moderate depression, 15-19 = Moderately severe depression, 20-27 = Severe depression   Psychosocial Evaluation and Intervention: Psychosocial Evaluation - 04/04/19 1355      Psychosocial Evaluation & Interventions   Interventions  Stress management education;Encouraged to exercise with the program and follow exercise prescription    Comments  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 He hopes this program  helps some with his stamina. He does have someone that helps out with some of the farm duties, but paying him is difficult sometimes. His girlfriend is very supportive and his son lives down the road from him if he needs anything    Expected Outcomes  Short: attend [facility] to increase stamina Long: develop self care habits.    Continue Psychosocial Services   Follow up required by staff       Psychosocial Re-Evaluation:   Psychosocial Discharge (Final Psychosocial Re-Evaluation):   Vocational Rehabilitation: Provide vocational rehab assistance to qualifying candidates.   Vocational Rehab Evaluation & Intervention: Vocational Rehab - 04/04/19 1308      Initial Vocational Rehab Evaluation & Intervention   Assessment shows need for Vocational Rehabilitation  No       Education: Education Goals: Education classes will be provided on a variety of topics geared toward better understanding of heart health and risk factor modification. Participant will state understanding/return demonstration of topics presented as noted by  education test scores.  Learning Barriers/Preferences: Learning Barriers/Preferences - 04/04/19 1308      Learning Barriers/Preferences   Learning Barriers  None    Learning Preferences  None       Education Topics:  AED/CPR: - Group verbal and written instruction with the use of models to demonstrate the basic use of the AED with the basic ABC's of resuscitation.   General Nutrition Guidelines/Fats and Fiber: -Group instruction provided by verbal, written material, models and posters to present the general guidelines for heart healthy nutrition. Gives an explanation and review of dietary fats and fiber.   Controlling Sodium/Reading Food Labels: -Group verbal and written material supporting the discussion of sodium use in heart healthy nutrition. Review and explanation with models, verbal and written materials for utilization of the food  label.   Exercise Physiology & General Exercise Guidelines: - Group verbal and written instruction with models to review the exercise physiology of the cardiovascular system and associated critical values. Provides general exercise guidelines with specific guidelines to those with heart or lung disease.    Aerobic Exercise & Resistance Training: - Gives group verbal and written instruction on the various components of exercise. Focuses on aerobic and resistive training programs and the benefits of this training and how to safely progress through these programs..   Flexibility, Balance, Mind/Body Relaxation: Provides group verbal/written instruction on the benefits of flexibility and balance training, including mind/body exercise modes such as yoga, pilates and tai chi.  Demonstration and skill practice provided.   Stress and Anxiety: - Provides group verbal and written instruction about the health risks of elevated stress and causes of high stress.  Discuss the correlation between heart/lung disease and anxiety and treatment options. Review healthy ways to manage with stress and anxiety.   Depression: - Provides group verbal and written instruction on the correlation between heart/lung disease and depressed mood, treatment options, and the stigmas associated with seeking treatment.   Anatomy & Physiology of the Heart: - Group verbal and written instruction and models provide basic cardiac anatomy and physiology, with the coronary electrical and arterial systems. Review of Valvular disease and Heart Failure   Cardiac Procedures: - Group verbal and written instruction to review commonly prescribed medications for heart disease. Reviews the medication, class of the drug, and side effects. Includes the steps to properly store meds and maintain the prescription regimen. (beta blockers and nitrates)   Cardiac Medications I: - Group verbal and written instruction to review commonly  prescribed medications for heart disease. Reviews the medication, class of the drug, and side effects. Includes the steps to properly store meds and maintain the prescription regimen.   Cardiac Medications II: -Group verbal and written instruction to review commonly prescribed medications for heart disease. Reviews the medication, class of the drug, and side effects. (all other drug classes)    Go Sex-Intimacy & Heart Disease, Get SMART - Goal Setting: - Group verbal and written instruction through game format to discuss heart disease and the return to sexual intimacy. Provides group verbal and written material to discuss and apply goal setting through the application of the S.M.A.R.T. Method.   Other Matters of the Heart: - Provides group verbal, written materials and models to describe Stable Angina and Peripheral Artery. Includes description of the disease process and treatment options available to the cardiac patient.   Exercise & Equipment Safety: - Individual verbal instruction and demonstration of equipment use and safety with use of the equipment.   Pulmonary Rehab from  04/08/2019 in [facility] Cardiac and Pulmonary Rehab  Date  04/08/19  Educator  [facility]  Instruction Review Code  1- Verbalizes Understanding      Infection Prevention: - Provides verbal and written material to individual with discussion of infection control including proper hand washing and proper equipment cleaning during exercise session.   Pulmonary Rehab from 04/08/2019 in [facility] Cardiac and Pulmonary Rehab  Date  04/08/19  Educator  [facility]  Instruction Review Code  1- Verbalizes Understanding      Falls Prevention: - Provides verbal and written material to individual with discussion of falls prevention and safety.   Pulmonary Rehab from 04/08/2019 in [facility] Cardiac and Pulmonary Rehab  Date  04/08/19  Educator  [facility]  Instruction Review Code  1- Verbalizes Understanding      Diabetes: - Individual verbal and  written instruction to review signs/symptoms of diabetes, desired ranges of glucose level fasting, after meals and with exercise. Acknowledge that pre and post exercise glucose checks will be done for 3 sessions at entry of program.   Know Your Numbers and Risk Factors: -Group verbal and written instruction about important numbers in your health.  Discussion of what are risk factors and how they play a role in the disease process.  Review of Cholesterol, Blood Pressure, Diabetes, and BMI and the role they play in your overall health.   Sleep Hygiene: -Provides group verbal and written instruction about how sleep can affect your health.  Define sleep hygiene, discuss sleep cycles and impact of sleep habits. Review good sleep hygiene tips.    Other: -Provides group and verbal instruction on various topics (see comments)   Knowledge Questionnaire Score: Knowledge Questionnaire Score - 04/08/19 1314      Knowledge Questionnaire Score   Pre Score  13/16 Education Focus: Exercise, PLB, oxygen safety       Core Components/Risk Factors/Patient Goals at Admission: Personal Goals and Risk Factors at Admission - 04/08/19 1315      Core Components/Risk Factors/Patient Goals on Admission    Weight Management  Yes;Weight Loss    Intervention  Weight Management: Develop a combined nutrition and exercise program designed to reach desired caloric intake, while maintaining appropriate intake of nutrient and fiber, sodium and fats, and appropriate energy expenditure required for the weight goal.;Weight Management: Provide education and appropriate resources to help participant work on and attain dietary goals.    Admit Weight  159 lb 8 oz (72.3 kg)    Goal Weight: Short Term  154 lb (69.9 kg)    Goal Weight: Long Term  150 lb (68 kg)    Expected Outcomes  Short Term: Continue to assess and modify interventions until short term weight is achieved;Long Term: Adherence to nutrition and physical  activity/exercise program aimed toward attainment of established weight goal;Weight Loss: Understanding of general recommendations for a balanced deficit meal plan, which promotes 1-2 lb weight loss per week and includes a negative energy balance of [id number] kcal/d;Understanding recommendations for meals to include 15-35% energy as protein, 25-35% energy from fat, 35-60% energy from carbohydrates, less than 256m of dietary cholesterol, 20-35 gm of total fiber daily;Understanding of distribution of calorie intake throughout the day with the consumption of 4-5 meals/snacks    Improve shortness of breath with ADL's  Yes    Intervention  Provide education, individualized exercise plan and daily activity instruction to help decrease symptoms of SOB with activities of daily living.    Expected Outcomes  Short Term: Improve cardiorespiratory fitness to achieve  a reduction of symptoms when performing ADLs;Long Term: Be able to perform more ADLs without symptoms or delay the onset of symptoms    Lipids  Yes    Intervention  Provide education and support for participant on nutrition & aerobic/resistive exercise along with prescribed medications to achieve LDL <35m, HDL >441m    Expected Outcomes  Short Term: Participant states understanding of desired cholesterol values and is compliant with medications prescribed. Participant is following exercise prescription and nutrition guidelines.;Long Term: Cholesterol controlled with medications as prescribed, with individualized exercise RX and with personalized nutrition plan. Value goals: LDL < 7072mHDL > 40 mg.       Core Components/Risk Factors/Patient Goals Review:    Core Components/Risk Factors/Patient Goals at Discharge (Final Review):    ITP Comments: ITP Comments    Row Name 04/04/19 1321 04/08/19 1258         ITP Comments  Initial telephone orientation completed. Diagnosis completed 9/23. EP orientation scheduled for 11/17 at 9:30  Completed 6MWT  and gym orientation.  Initial ITP created and sent for review to Dr. MarEmily Filbertedical Director.         Comments: Initial ITP

## 2019-04-11 ENCOUNTER — Encounter: Payer: Medicare Other | Admitting: *Deleted

## 2019-04-11 ENCOUNTER — Other Ambulatory Visit: Payer: Self-pay

## 2019-04-11 DIAGNOSIS — J8410 Pulmonary fibrosis, unspecified: Secondary | ICD-10-CM | POA: Diagnosis not present

## 2019-04-11 NOTE — Progress Notes (Signed)
Daily Session Note  Patient Details  Name: Louis Gomez MRN: [id number] Date of Birth: [birth year] Referring Provider:     Pulmonary Rehab from 04/08/2019 in [facility] Cardiac and Pulmonary Rehab  Referring Provider  Ancil Linsey MD      Encounter Date: 04/11/2019  Check In: Session Check In - 04/11/19 1132      Check-In   Supervising physician immediately available to respond to emergencies  See telemetry face sheet for immediately available ER MD    Location  [facility]-Cardiac & Pulmonary Rehab    Staff Present  Renita Papa, RN Vickki Hearing, BA, ACSM CEP, Exercise Physiologist    Virtual Visit  No    Medication changes reported      No    Fall or balance concerns reported     No    Warm-up and Cool-down  Performed on first and last piece of equipment    Resistance Training Performed  Yes    VAD Patient?  No    PAD/SET Patient?  No      Pain Assessment   Currently in Pain?  No/denies          Social History   Tobacco Use  Smoking Status Never Smoker  Smokeless Tobacco Current User  . Types: Chew    Goals Met:  Proper associated with RPD/PD & O2 Sat Independence with exercise equipment Using PLB without cueing & demonstrates good technique Exercise tolerated well No report of cardiac concerns or symptoms Strength training completed today  Goals Unmet:  Not Applicable  Comments: First full day of exercise!  Patient was oriented to gym and equipment including functions, settings, policies, and procedures.  Patient's individual exercise prescription and treatment plan were reviewed.  All starting workloads were established based on the results of the 6 minute walk test done at initial orientation visit.  The plan for exercise progression was also introduced and progression will be customized based on patient's performance and goals.    Dr. Emily Filbert is Medical Director for [place] and [facility].

## 2019-04-14 ENCOUNTER — Encounter: Payer: Medicare Other | Admitting: *Deleted

## 2019-04-14 ENCOUNTER — Other Ambulatory Visit: Payer: Self-pay

## 2019-04-14 DIAGNOSIS — J8410 Pulmonary fibrosis, unspecified: Secondary | ICD-10-CM | POA: Diagnosis not present

## 2019-04-14 NOTE — Progress Notes (Signed)
Daily Session Note  Patient Details  Name: Louis Gomez MRN: [id number] Date of Birth: 13-Aug-1940 Referring Provider:     Pulmonary Rehab from 04/08/2019 in [facility] Cardiac and Pulmonary Rehab  Referring Provider  Ancil Linsey MD      Encounter Date: 04/14/2019  Check In: Session Check In - 04/14/19 1043      Check-In   Supervising physician immediately available to respond to emergencies  See telemetry face sheet for immediately available ER MD    Location  [facility]-Cardiac & Pulmonary Rehab    Staff Present  Renita Papa, RN BSN;Jessica [place], MA, RCEP, CCRP, [place], BS, ACSM CEP, Exercise Physiologist    Virtual Visit  No    Medication changes reported      No    Fall or balance concerns reported     No    Warm-up and Cool-down  Performed on first and last piece of equipment    Resistance Training Performed  Yes    VAD Patient?  No    PAD/SET Patient?  No      Pain Assessment   Currently in Pain?  No/denies          Social History   Tobacco Use  Smoking Status Never Smoker  Smokeless Tobacco Current User  . Types: Chew    Goals Met:  Proper associated with RPD/PD & O2 Sat Independence with exercise equipment Using PLB without cueing & demonstrates good technique Exercise tolerated well No report of cardiac concerns or symptoms Strength training completed today  Goals Unmet:  Not Applicable  Comments: Pt able to follow exercise prescription today without complaint.  Will continue to monitor for progression.    Dr. Emily Filbert is Medical Director for [place] and [facility].

## 2019-04-16 ENCOUNTER — Encounter: Payer: Medicare Other | Admitting: *Deleted

## 2019-04-16 ENCOUNTER — Other Ambulatory Visit: Payer: Self-pay

## 2019-04-16 DIAGNOSIS — J8410 Pulmonary fibrosis, unspecified: Secondary | ICD-10-CM

## 2019-04-16 NOTE — Progress Notes (Signed)
Daily Session Note  Patient Details  Name: GATLYN LIPARI MRN: [id number] Date of Birth: [birth year] Referring Provider:     Pulmonary Rehab from 04/08/2019 in [facility] Cardiac and Pulmonary Rehab  Referring Provider  Ancil Linsey MD      Encounter Date: 04/16/2019  Check In: Session Check In - 04/16/19 1200      Check-In   Supervising physician immediately available to respond to emergencies  See telemetry face sheet for immediately available ER MD    Location  [facility]-Cardiac & Pulmonary Rehab    Staff Present  Renita Papa, RN Vickki Hearing, BA, ACSM CEP, Exercise Physiologist;Jeanna Durrell BS, Exercise Physiologist    Virtual Visit  No    Medication changes reported      No    Fall or balance concerns reported     No    Warm-up and Cool-down  Performed on first and last piece of equipment    Resistance Training Performed  Yes    VAD Patient?  No    PAD/SET Patient?  No      Pain Assessment   Currently in Pain?  No/denies          Social History   Tobacco Use  Smoking Status Never Smoker  Smokeless Tobacco Current User  . Types: Chew    Goals Met:  Independence with exercise equipment Exercise tolerated well No report of cardiac concerns or symptoms Strength training completed today  Goals Unmet:  Not Applicable  Comments: Pt able to follow exercise prescription today without complaint.  Will continue to monitor for progression.    Dr. Emily Filbert is Medical Director for [place] and [facility].

## 2019-04-21 ENCOUNTER — Other Ambulatory Visit: Payer: Self-pay

## 2019-04-21 ENCOUNTER — Encounter: Payer: Medicare Other | Admitting: *Deleted

## 2019-04-21 DIAGNOSIS — J8410 Pulmonary fibrosis, unspecified: Secondary | ICD-10-CM

## 2019-04-21 NOTE — Progress Notes (Signed)
Daily Session Note  Patient Details  Name: Louis Gomez MRN: [id number] Date of Birth: Apr 27, 1941 Referring Provider:     Pulmonary Rehab from 04/08/2019 in [facility] Cardiac and Pulmonary Rehab  Referring Provider  Ancil Linsey MD      Encounter Date: 04/21/2019  Check In: Session Check In - 04/21/19 1054      Check-In   Supervising physician immediately available to respond to emergencies  See telemetry face sheet for immediately available ER MD    Location  [facility]-Cardiac & Pulmonary Rehab    Staff Present  Renita Papa, RN BSN;Jessica [place], MA, RCEP, CCRP, [place], BS, ACSM CEP, Exercise Physiologist    Virtual Visit  No    Medication changes reported      No    Fall or balance concerns reported     No    Warm-up and Cool-down  Performed on first and last piece of equipment    Resistance Training Performed  Yes    VAD Patient?  No    PAD/SET Patient?  No      Pain Assessment   Currently in Pain?  No/denies          Social History   Tobacco Use  Smoking Status Never Smoker  Smokeless Tobacco Current User  . Types: Chew    Goals Met:  Independence with exercise equipment Exercise tolerated well No report of cardiac concerns or symptoms Strength training completed today  Goals Unmet:  Not Applicable  Comments: Pt able to follow exercise prescription today without complaint.  Will continue to monitor for progression.    Dr. Emily Filbert is Medical Director for [place] and [facility].

## 2019-04-22 ENCOUNTER — Encounter: Payer: Medicare Other | Attending: Specialist

## 2019-04-22 DIAGNOSIS — J8410 Pulmonary fibrosis, unspecified: Secondary | ICD-10-CM | POA: Insufficient documentation

## 2019-04-22 NOTE — Progress Notes (Signed)
Completed intial RD eval

## 2019-04-23 ENCOUNTER — Other Ambulatory Visit: Payer: Self-pay

## 2019-04-23 ENCOUNTER — Encounter: Payer: Medicare Other | Admitting: *Deleted

## 2019-04-23 ENCOUNTER — Encounter: Payer: Self-pay | Admitting: *Deleted

## 2019-04-23 DIAGNOSIS — J8410 Pulmonary fibrosis, unspecified: Secondary | ICD-10-CM

## 2019-04-23 NOTE — Progress Notes (Signed)
Pulmonary Individual Treatment Plan  Patient Details  Name: Louis Gomez MRN: [id number] Date of Birth: Mar 22, 1941 Referring Provider:     Pulmonary Rehab from 04/08/2019 in [facility] Cardiac and Pulmonary Rehab  Referring Provider  Ancil Linsey MD      Initial Encounter Date:    Pulmonary Rehab from 04/08/2019 in [facility] Cardiac and Pulmonary Rehab  Date  04/08/19      Visit Diagnosis: Pulmonary fibrosis ([place])  Patient's Home Medications on Admission:  Current Outpatient Medications:  .  aspirin EC 81 MG tablet, Take by mouth., Disp: , Rfl:  .  atorvastatin (LIPITOR) 40 MG tablet, , Disp: , Rfl:  .  azelastine (ASTELIN) 0.1 % nasal spray, Place into the nose., Disp: , Rfl:  .  Multiple Vitamin (MULTIVITAMIN) capsule, Take by mouth., Disp: , Rfl:   Past Medical History: No past medical history on file.  Tobacco Use: Social History   Tobacco Use  Smoking Status Never Smoker  Smokeless Tobacco Current User  . Types: Chew    Labs: Recent Review Flowsheet Data    There is no flowsheet data to display.       Pulmonary Assessment Scores: Pulmonary Assessment Scores    Row Name 04/08/19 1318         ADL UCSD   ADL Phase  Entry     SOB Score total  17     Rest  0     Walk  1     Stairs  2     Bath  0     Dress  1     Shop  0       CAT Score   CAT Score  6       mMRC Score   mMRC Score  2        UCSD: Self-administered rating of dyspnea associated with activities of daily living (ADLs) 6-point scale (0 = "not at all" to 5 = "maximal or unable to do because of breathlessness")  Scoring Scores range from 0 to 120.  Minimally important difference is 5 units  CAT: CAT can identify the health impairment of COPD patients and is better correlated with disease progression.  CAT has a scoring range of zero to 40. The CAT score is classified into four groups of low (less than 10), medium (10 - 20), high (21-30) and very high (31-40) based on the impact level of  disease on health status. A CAT score over 10 suggests significant symptoms.  A worsening CAT score could be explained by an exacerbation, poor medication adherence, poor inhaler technique, or progression of COPD or comorbid conditions.  CAT MCID is 2 points  mMRC: mMRC (Modified Medical Research Council) Dyspnea Scale is used to assess the degree of baseline functional disability in patients of respiratory disease due to dyspnea. No minimal important difference is established. A decrease in score of 1 point or greater is considered a positive change.   Pulmonary Function Assessment:   Exercise Target Goals: Exercise Program Goal: Individual exercise prescription set using results from initial 6 min walk test and THRR while considering  patient's activity barriers and safety.   Exercise Prescription Goal: Initial exercise prescription builds to 30-45 minutes a day of aerobic activity, 2-3 days per week.  Home exercise guidelines will be given to patient during program as part of exercise prescription that the participant will acknowledge.  Activity Barriers & Risk Stratification: Activity Barriers & Cardiac Risk Stratification - 04/08/19 1303  Activity Barriers & Cardiac Risk Stratification   Activity Barriers  Deconditioning;Muscular Weakness;Balance Concerns;Shortness of Breath       6 Minute Walk: 6 Minute Walk    Row Name 04/08/19 1301         6 Minute Walk   Phase  Initial     Distance  1135 feet     Walk Time  6 minutes     # of Rest Breaks  0     MPH  2.15     METS  2.45     RPE  14     Perceived Dyspnea   3     VO2 Peak  8.58     Symptoms  Yes (comment)     Comments  SOB     Resting HR  63 bpm     Resting BP  122/64     Resting Oxygen Saturation   94 %     Exercise Oxygen Saturation  during 6 min walk  88 %     Max Ex. HR  103 bpm     Max Ex. BP  146/74     2 Minute Post BP  134/70       Interval HR   1 Minute HR  87     2 Minute HR  99     3 Minute  HR  77     4 Minute HR  62     5 Minute HR  63     6 Minute HR  103     2 Minute Post HR  67     Interval Heart Rate?  Yes       Interval Oxygen   Interval Oxygen?  Yes     Baseline Oxygen Saturation %  94 %     1 Minute Oxygen Saturation %  93 %     1 Minute Liters of Oxygen  0 L Room Air     2 Minute Oxygen Saturation %  92 %     2 Minute Liters of Oxygen  0 L     3 Minute Oxygen Saturation %  91 %     3 Minute Liters of Oxygen  0 L     4 Minute Oxygen Saturation %  97 %     4 Minute Liters of Oxygen  0 L     5 Minute Oxygen Saturation %  89 %     5 Minute Liters of Oxygen  0 L     6 Minute Oxygen Saturation %  88 %     6 Minute Liters of Oxygen  0 L     2 Minute Post Oxygen Saturation %  94 %     2 Minute Post Liters of Oxygen  0 L       Oxygen Initial Assessment: Oxygen Initial Assessment - 04/08/19 1317      Home Oxygen   Home Oxygen Device  None    Sleep Oxygen Prescription  None    Home Exercise Oxygen Prescription  None    Home at Rest Exercise Oxygen Prescription  None      Initial 6 min Walk   Oxygen Used  None      Program Oxygen Prescription   Program Oxygen Prescription  None      Intervention   Short Term Goals  To learn and understand importance of maintaining oxygen saturations>88%;To learn and demonstrate proper pursed lip breathing techniques or other breathing techniques.;To  learn and understand importance of monitoring SPO2 with pulse oximeter and demonstrate accurate use of the pulse oximeter.    Long  Term Goals  Maintenance of O2 saturations>88%;Verbalizes importance of monitoring SPO2 with pulse oximeter and return demonstration;Exhibits proper breathing techniques, such as pursed lip breathing or other method taught during program session       Oxygen Re-Evaluation: Oxygen Re-Evaluation    Row Name 04/11/19 1137             Program Oxygen Prescription   Program Oxygen Prescription  None         Home Oxygen   Home Oxygen Device  None        Sleep Oxygen Prescription  None       Home Exercise Oxygen Prescription  None       Home at Rest Exercise Oxygen Prescription  None         Goals/Expected Outcomes   Short Term Goals  To learn and understand importance of maintaining oxygen saturations>88%;To learn and demonstrate proper pursed lip breathing techniques or other breathing techniques.;To learn and understand importance of monitoring SPO2 with pulse oximeter and demonstrate accurate use of the pulse oximeter.       Long  Term Goals  Maintenance of O2 saturations>88%;Verbalizes importance of monitoring SPO2 with pulse oximeter and return demonstration;Exhibits proper breathing techniques, such as pursed lip breathing or other method taught during program session       Comments  Reviewed PLB technique with pt.  Talked about how it work and it's important to maintaining his exercise saturations.       Goals/Expected Outcomes  Short: Become more profiecient at using PLB.   Long: Become independent at using PLB.          Oxygen Discharge (Final Oxygen Re-Evaluation): Oxygen Re-Evaluation - 04/11/19 1137      Program Oxygen Prescription   Program Oxygen Prescription  None      Home Oxygen   Home Oxygen Device  None    Sleep Oxygen Prescription  None    Home Exercise Oxygen Prescription  None    Home at Rest Exercise Oxygen Prescription  None      Goals/Expected Outcomes   Short Term Goals  To learn and understand importance of maintaining oxygen saturations>88%;To learn and demonstrate proper pursed lip breathing techniques or other breathing techniques.;To learn and understand importance of monitoring SPO2 with pulse oximeter and demonstrate accurate use of the pulse oximeter.    Long  Term Goals  Maintenance of O2 saturations>88%;Verbalizes importance of monitoring SPO2 with pulse oximeter and return demonstration;Exhibits proper breathing techniques, such as pursed lip breathing or other method taught during program  session    Comments  Reviewed PLB technique with pt.  Talked about how it work and it's important to maintaining his exercise saturations.    Goals/Expected Outcomes  Short: Become more profiecient at using PLB.   Long: Become independent at using PLB.       Initial Exercise Prescription: Initial Exercise Prescription - 04/08/19 1300      Date of Initial Exercise RX and Referring Provider   Date  04/08/19    Referring Provider  Ancil Linsey MD      Treadmill   MPH  1.9    Grade  0.5    Minutes  15    METs  2.59      Recumbant Bike   Level  1    RPM  50    Watts  10    Minutes  15    METs  2.5      NuStep   Level  1    SPM  80    Minutes  15    METs  2.5      Recumbant Elliptical   Level  1    RPM  50    Minutes  15    METs  2.5      Prescription Details   Frequency (times per week)  3    Duration  Progress to 30 minutes of continuous aerobic without signs/symptoms of physical distress      Intensity   THRR 40-80% of Max Heartrate  95-127    Ratings of Perceived Exertion  11-13    Perceived Dyspnea  0-4      Progression   Progression  Continue to progress workloads to maintain intensity without signs/symptoms of physical distress.      Resistance Training   Training Prescription  Yes    Weight  3 lb    Reps  10-15       Perform Capillary Blood Glucose checks as needed.  Exercise Prescription Changes: Exercise Prescription Changes    Row Name 04/08/19 1300 04/22/19 1000           Response to Exercise   Blood Pressure (Admit)  122/64  126/74      Blood Pressure (Exercise)  142/64  146/80      Blood Pressure (Exit)  134/70  106/64      Heart Rate (Admit)  63 bpm  69 bpm      Heart Rate (Exercise)  103 bpm  107 bpm      Heart Rate (Exit)  64 bpm  68 bpm      Oxygen Saturation (Admit)  94 %  95 %      Oxygen Saturation (Exercise)  88 %  89 %      Oxygen Saturation (Exit)  94 %  92 %      Rating of Perceived Exertion (Exercise)  14  12       Perceived Dyspnea (Exercise)  3  3      Symptoms  SOB  -      Comments  walk test results  -      Duration  -  Continue with 30 min of aerobic exercise without signs/symptoms of physical distress.      Intensity  -  THRR unchanged        Progression   Progression  -  Continue to progress workloads to maintain intensity without signs/symptoms of physical distress.      Average METs  -  2.7        Resistance Training   Training Prescription  -  Yes      Weight  -  3 lb      Reps  -  10-15        NuStep   Level  -  3      SPM  -  80      Minutes  -  15      METs  -  4.1        Recumbant Elliptical   Level  -  1      RPM  -  50      Minutes  -  15      METs  -  1.8         Exercise Comments:  Exercise Goals and Review: Exercise Goals    Row Name 04/08/19 1312             Exercise Goals   Increase Physical Activity  Yes       Intervention  Provide advice, education, support and counseling about physical activity/exercise needs.;Develop an individualized exercise prescription for aerobic and resistive training based on initial evaluation findings, risk stratification, comorbidities and participant's personal goals.       Expected Outcomes  Short Term: Attend rehab on a regular basis to increase amount of physical activity.;Long Term: Exercising regularly at least 3-5 days a week.;Long Term: Add in home exercise to make exercise part of routine and to increase amount of physical activity.       Increase Strength and Stamina  Yes       Intervention  Provide advice, education, support and counseling about physical activity/exercise needs.;Develop an individualized exercise prescription for aerobic and resistive training based on initial evaluation findings, risk stratification, comorbidities and participant's personal goals.       Expected Outcomes  Short Term: Increase workloads from initial exercise prescription for resistance, speed, and METs.;Short Term: Perform resistance  training exercises routinely during rehab and add in resistance training at home;Long Term: Improve cardiorespiratory fitness, muscular endurance and strength as measured by increased METs and functional capacity (6MWT)       Able to understand and use rate of perceived exertion (RPE) scale  Yes       Intervention  Provide education and explanation on how to use RPE scale       Expected Outcomes  Short Term: Able to use RPE daily in rehab to express subjective intensity level;Long Term:  Able to use RPE to guide intensity level when exercising independently       Able to understand and use Dyspnea scale  Yes       Intervention  Provide education and explanation on how to use Dyspnea scale       Expected Outcomes  Short Term: Able to use Dyspnea scale daily in rehab to express subjective sense of shortness of breath during exertion;Long Term: Able to use Dyspnea scale to guide intensity level when exercising independently       Knowledge and understanding of Target Heart Rate Range (THRR)  Yes       Intervention  Provide education and explanation of THRR including how the numbers were predicted and where they are located for reference       Expected Outcomes  Short Term: Able to state/look up THRR;Long Term: Able to use THRR to govern intensity when exercising independently;Short Term: Able to use daily as guideline for intensity in rehab       Able to check pulse independently  Yes       Intervention  Provide education and demonstration on how to check pulse in carotid and radial arteries.;Review the importance of being able to check your own pulse for safety during independent exercise       Expected Outcomes  Short Term: Able to explain why pulse checking is important during independent exercise;Long Term: Able to check pulse independently and accurately       Understanding of Exercise Prescription  Yes       Intervention  Provide education, explanation, and written materials on patient's individual  exercise prescription       Expected Outcomes  Short Term: Able to explain program exercise prescription;Long Term: Able to explain home exercise prescription to exercise independently  Exercise Goals Re-Evaluation : Exercise Goals Re-Evaluation    Row Name 04/11/19 1134 04/22/19 1008           Exercise Goal Re-Evaluation   Exercise Goals Review  Increase Physical Activity;Able to understand and use rate of perceived exertion (RPE) scale;Knowledge and understanding of Target Heart Rate Range (THRR);Understanding of Exercise Prescription;Increase Strength and Stamina;Able to understand and use Dyspnea scale;Able to check pulse independently  Increase Physical Activity;Increase Strength and Stamina;Able to understand and use rate of perceived exertion (RPE) scale;Able to understand and use Dyspnea scale;Knowledge and understanding of Target Heart Rate Range (THRR);Able to check pulse independently;Understanding of Exercise Prescription      Comments  Reviewed RPE scale, THR and program prescription with pt today.  Pt voiced understanding and was given a copy of goals to take home.  Jaykwon is doing well in rehab.  He has moved up level on T4.  Oxygen stays above 88 % during exercise.      Expected Outcomes  Short: Use RPE daily to regulate intensity. Long: Follow program prescription in THR.  Short - continue to attend consistently Long -  increase overall stamina         Discharge Exercise Prescription (Final Exercise Prescription Changes): Exercise Prescription Changes - 04/22/19 1000      Response to Exercise   Blood Pressure (Admit)  126/74    Blood Pressure (Exercise)  146/80    Blood Pressure (Exit)  106/64    Heart Rate (Admit)  69 bpm    Heart Rate (Exercise)  107 bpm    Heart Rate (Exit)  68 bpm    Oxygen Saturation (Admit)  95 %    Oxygen Saturation (Exercise)  89 %    Oxygen Saturation (Exit)  92 %    Rating of Perceived Exertion (Exercise)  12    Perceived Dyspnea  (Exercise)  3    Duration  Continue with 30 min of aerobic exercise without signs/symptoms of physical distress.    Intensity  THRR unchanged      Progression   Progression  Continue to progress workloads to maintain intensity without signs/symptoms of physical distress.    Average METs  2.7      Resistance Training   Training Prescription  Yes    Weight  3 lb    Reps  10-15      NuStep   Level  3    SPM  80    Minutes  15    METs  4.1      Recumbant Elliptical   Level  1    RPM  50    Minutes  15    METs  1.8       Nutrition:  Target Goals: Understanding of nutrition guidelines, daily intake of sodium <1587m, cholesterol <2051m calories 30% from fat and 7% or less from saturated fats, daily to have 5 or more servings of fruits and vegetables.  Biometrics: Pre Biometrics - 04/08/19 1313      Pre Biometrics   Height  _0  (1.676 m)    Weight  159 lb 8 oz (72.3 kg)    BMI (Calculated)  25.76    Single Leg Stand  1.63 seconds        Nutrition Therapy Plan and Nutrition Goals: Nutrition Therapy & Goals - 04/22/19 1202      Nutrition Therapy   Diet  Low Na, HH diet    Protein (specify units)  55-60g    Fiber  30 grams    Whole Grain Foods  3 servings    Saturated Fats  12 max. grams    Fruits and Vegetables  5 servings/day    Sodium  1.5 grams      Personal Nutrition Goals   Nutrition Goal  ST: continue current diet LT: Improve breathing and reduce SOB.    Comments  B: coffee (sugar + half n half) + bowl cereal (raisin bran w/ whole milk) or sausage/ham biscuit. D: sandwich (meat, cheese, on whole wheat) pepsi and sometimes chips and snack cakes or salad or leftovers from family or TV dinners. water during the day, doesn't drink as much since it got cold. Pt reports not wanting to make any changes. Discussed HH eating and making sure getting enough kcal and protein. Pt reports being weight stable, but energy is low. Will continue to monitor, pt does not want to  make any chanegs at this time.      Intervention Plan   Intervention  Prescribe, educate and counsel regarding individualized specific dietary modifications aiming towards targeted core components such as weight, hypertension, lipid management, diabetes, heart failure and other comorbidities.;Nutrition handout(s) given to patient.    Expected Outcomes  Short Term Goal: Understand basic principles of dietary content, such as calories, fat, sodium, cholesterol and nutrients.;Short Term Goal: A plan has been developed with personal nutrition goals set during dietitian appointment.;Long Term Goal: Adherence to prescribed nutrition plan.       Nutrition Assessments: Nutrition Assessments - 04/08/19 1138      MEDFICTS Scores   Pre Score  95       Nutrition Goals Re-Evaluation:   Nutrition Goals Discharge (Final Nutrition Goals Re-Evaluation):   Psychosocial: Target Goals: Acknowledge presence or absence of significant depression and/or stress, maximize coping skills, provide positive support system. Participant is able to verbalize types and ability to use techniques and skills needed for reducing stress and depression.   Initial Review & Psychosocial Screening: Initial Psych Review & Screening - 04/04/19 1308      Initial Review   Current issues with  Current Stress Concerns    Source of Stress Concerns  Financial      Family Dynamics   Good Support System?  Yes      Barriers   Psychosocial barriers to participate in program  There are no identifiable barriers or psychosocial needs.      Screening Interventions   Interventions  Encouraged to exercise;To provide support and resources with identified psychosocial needs;Provide feedback about the scores to participant    Expected Outcomes  Short Term goal: Utilizing psychosocial counselor, staff and physician to assist with identification of specific Stressors or current issues interfering with healing process. Setting desired goal  for each stressor or current issue identified.;Long Term Goal: Stressors or current issues are controlled or eliminated.;Short Term goal: Identification and review with participant of any Quality of Life or Depression concerns found by scoring the questionnaire.;Long Term goal: The participant improves quality of Life and PHQ9 Scores as seen by post scores and/or verbalization of changes       Quality of Life Scores:  Scores of 19 and below usually indicate a poorer quality of life in these areas.  A difference of  2-3 points is a clinically meaningful difference.  A difference of 2-3 points in the total score of the Quality of Life Index has been associated with significant improvement in overall quality of life, self-image, physical symptoms, and general health in studies assessing  change in quality of life.  PHQ-9: Recent Review Flowsheet Data    Depression screen [facility] 2/9 04/08/2019   Decreased Interest 0   Down, Depressed, Hopeless 0   PHQ - 2 Score 0   Altered sleeping 0   Tired, decreased energy 1   Change in appetite 1   Feeling bad or failure about yourself  0   Trouble concentrating 0   Moving slowly or fidgety/restless 0   Suicidal thoughts 0   PHQ-9 Score 2   Difficult doing work/chores Not difficult at all     Interpretation of Total Score  Total Score Depression Severity:  1-4 = Minimal depression, 5-9 = Mild depression, 10-14 = Moderate depression, 15-19 = Moderately severe depression, 20-27 = Severe depression   Psychosocial Evaluation and Intervention: Psychosocial Evaluation - 04/04/19 1355      Psychosocial Evaluation & Interventions   Interventions  Stress management education;Encouraged to exercise with the program and follow exercise prescription    Comments  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 He hopes this program helps some with his stamina. He does have someone that helps out with some of the farm  duties, but paying him is difficult sometimes. His girlfriend is very supportive and his son lives down the road from him if he needs anything    Expected Outcomes  Short: attend [facility] to increase stamina Long: develop self care habits.    Continue Psychosocial Services   Follow up required by staff       Psychosocial Re-Evaluation:   Psychosocial Discharge (Final Psychosocial Re-Evaluation):   Education: Education Goals: Education classes will be provided on a weekly basis, covering required topics. Participant will state understanding/return demonstration of topics presented.  Learning Barriers/Preferences: Learning Barriers/Preferences - 04/04/19 1308      Learning Barriers/Preferences   Learning Barriers  None    Learning Preferences  None       Education Topics:  Initial Evaluation Education: - Verbal, written and demonstration of respiratory meds, oximetry and breathing techniques. Instruction on use of nebulizers and MDIs and importance of monitoring MDI activations.   Pulmonary Rehab from 04/23/2019 in [facility] Cardiac and Pulmonary Rehab  Date  04/08/19  Educator  [facility]  Instruction Review Code  1- Verbalizes Understanding      General Nutrition Guidelines/Fats and Fiber: -Group instruction provided by verbal, written material, models and posters to present the general guidelines for heart healthy nutrition. Gives an explanation and review of dietary fats and fiber.   Controlling Sodium/Reading Food Labels: -Group verbal and written material supporting the discussion of sodium use in heart healthy nutrition. Review and explanation with models, verbal and written materials for utilization of the food label.   Exercise Physiology & General Exercise Guidelines: - Group verbal and written instruction with models to review the exercise physiology of the cardiovascular system and associated critical values. Provides general exercise guidelines with specific guidelines to  those with heart or lung disease.    Aerobic Exercise & Resistance Training: - Gives group verbal and written instruction on the various components of exercise. Focuses on aerobic and resistive training programs and the benefits of this training and how to safely progress through these programs.   Flexibility, Balance, Mind/Body Relaxation: Provides group verbal/written instruction on the benefits of flexibility and balance training, including mind/body exercise modes such as yoga, pilates and tai chi.  Demonstration and skill practice provided.  Pulmonary Rehab from 04/23/2019 in [facility] Cardiac and Pulmonary Rehab  Date  04/23/19  Educator  [facility]  Instruction Review Code  1- Verbalizes Understanding      Stress and Anxiety: - Provides group verbal and written instruction about the health risks of elevated stress and causes of high stress.  Discuss the correlation between heart/lung disease and anxiety and treatment options. Review healthy ways to manage with stress and anxiety.   Depression: - Provides group verbal and written instruction on the correlation between heart/lung disease and depressed mood, treatment options, and the stigmas associated with seeking treatment.   Exercise & Equipment Safety: - Individual verbal instruction and demonstration of equipment use and safety with use of the equipment.   Pulmonary Rehab from 04/23/2019 in [facility] Cardiac and Pulmonary Rehab  Date  04/08/19  Educator  [facility]  Instruction Review Code  1- Verbalizes Understanding      Infection Prevention: - Provides verbal and written material to individual with discussion of infection control including proper hand washing and proper equipment cleaning during exercise session.   Pulmonary Rehab from 04/23/2019 in [facility] Cardiac and Pulmonary Rehab  Date  04/08/19  Educator  [facility]  Instruction Review Code  1- Verbalizes Understanding      Falls Prevention: - Provides verbal and written material to individual  with discussion of falls prevention and safety.   Pulmonary Rehab from 04/23/2019 in [facility] Cardiac and Pulmonary Rehab  Date  04/08/19  Educator  [facility]  Instruction Review Code  1- Verbalizes Understanding      Diabetes: - Individual verbal and written instruction to review signs/symptoms of diabetes, desired ranges of glucose level fasting, after meals and with exercise. Advice that pre and post exercise glucose checks will be done for 3 sessions at entry of program.   Chronic Lung Diseases: - Group verbal and written instruction to review updates, respiratory medications, advancements in procedures and treatments. Discuss use of supplemental oxygen including available portable oxygen systems, continuous and intermittent flow rates, concentrators, personal use and safety guidelines. Review proper use of inhaler and spacers. Provide informative websites for self-education.    Pulmonary Rehab from 04/23/2019 in [facility] Cardiac and Pulmonary Rehab  Date  04/23/19  Educator  [facility]  Instruction Review Code  1- Verbalizes Understanding      Energy Conservation: - Provide group verbal and written instruction for methods to conserve energy, plan and organize activities. Instruct on pacing techniques, use of adaptive equipment and posture/positioning to relieve shortness of breath.   Triggers and Exacerbations: - Group verbal and written instruction to review types of environmental triggers and ways to prevent exacerbations. Discuss weather changes, air quality and the benefits of nasal washing. Review warning signs and symptoms to help prevent infections. Discuss techniques for effective airway clearance, coughing, and vibrations.   AED/CPR: - Group verbal and written instruction with the use of models to demonstrate the basic use of the AED with the basic ABC's of resuscitation.   Anatomy and Physiology of the Lungs: - Group verbal and written instruction with the use of models to provide basic  lung anatomy and physiology related to function, structure and complications of lung disease.   Anatomy & Physiology of the Heart: - Group verbal and written instruction and models provide basic cardiac anatomy and physiology, with the coronary electrical and arterial systems. Review of Valvular disease and Heart Failure   Cardiac Medications: - Group verbal and written instruction to review commonly prescribed medications for heart disease. Reviews  the medication, class of the drug, and side effects.   Know Your Numbers and Risk Factors: -Group verbal and written instruction about important numbers in your health.  Discussion of what are risk factors and how they play a role in the disease process.  Review of Cholesterol, Blood Pressure, Diabetes, and BMI and the role they play in your overall health.   Sleep Hygiene: -Provides group verbal and written instruction about how sleep can affect your health.  Define sleep hygiene, discuss sleep cycles and impact of sleep habits. Review good sleep hygiene tips.    Other: -Provides group and verbal instruction on various topics (see comments)    Knowledge Questionnaire Score: Knowledge Questionnaire Score - 04/08/19 1314      Knowledge Questionnaire Score   Pre Score  13/16 Education Focus: Exercise, PLB, oxygen safety        Core Components/Risk Factors/Patient Goals at Admission: Personal Goals and Risk Factors at Admission - 04/08/19 1315      Core Components/Risk Factors/Patient Goals on Admission    Weight Management  Yes;Weight Loss    Intervention  Weight Management: Develop a combined nutrition and exercise program designed to reach desired caloric intake, while maintaining appropriate intake of nutrient and fiber, sodium and fats, and appropriate energy expenditure required for the weight goal.;Weight Management: Provide education and appropriate resources to help participant work on and attain dietary goals.    Admit Weight   159 lb 8 oz (72.3 kg)    Goal Weight: Short Term  154 lb (69.9 kg)    Goal Weight: Long Term  150 lb (68 kg)    Expected Outcomes  Short Term: Continue to assess and modify interventions until short term weight is achieved;Long Term: Adherence to nutrition and physical activity/exercise program aimed toward attainment of established weight goal;Weight Loss: Understanding of general recommendations for a balanced deficit meal plan, which promotes 1-2 lb weight loss per week and includes a negative energy balance of [phone] kcal/d;Understanding recommendations for meals to include 15-35% energy as protein, 25-35% energy from fat, 35-60% energy from carbohydrates, less than 218m of dietary cholesterol, 20-35 gm of total fiber daily;Understanding of distribution of calorie intake throughout the day with the consumption of 4-5 meals/snacks    Improve shortness of breath with ADL's  Yes    Intervention  Provide education, individualized exercise plan and daily activity instruction to help decrease symptoms of SOB with activities of daily living.    Expected Outcomes  Short Term: Improve cardiorespiratory fitness to achieve a reduction of symptoms when performing ADLs;Long Term: Be able to perform more ADLs without symptoms or delay the onset of symptoms    Lipids  Yes    Intervention  Provide education and support for participant on nutrition & aerobic/resistive exercise along with prescribed medications to achieve LDL <736m HDL >4065m   Expected Outcomes  Short Term: Participant states understanding of desired cholesterol values and is compliant with medications prescribed. Participant is following exercise prescription and nutrition guidelines.;Long Term: Cholesterol controlled with medications as prescribed, with individualized exercise RX and with personalized nutrition plan. Value goals: LDL < 38m85mDL > 40 mg.       Core Components/Risk Factors/Patient Goals Review:    Core Components/Risk  Factors/Patient Goals at Discharge (Final Review):    ITP Comments: ITP Comments    Row Name 04/04/19 1321 04/08/19 1258 04/11/19 1133 04/22/19 1227 04/23/19 1030   ITP Comments  Initial telephone orientation completed. Diagnosis completed 9/23. EP orientation scheduled  for 11/17 at 9:30  Completed 6MWT and gym orientation.  Initial ITP created and sent for review to Dr. Emily Filbert, Medical Director.  First full day of exercise!  Patient was oriented to gym and equipment including functions, settings, policies, and procedures.  Patient's individual exercise prescription and treatment plan were reviewed.  All starting workloads were established based on the results of the 6 minute walk test done at initial orientation visit.  The plan for exercise progression was also introduced and progression will be customized based on patient's performance and goals.  Completed intial RD eval  30 day review competed . ITP sent to Dr Emily Filbert for review, changes as needed and ITP approval signature.      Comments:

## 2019-04-23 NOTE — Progress Notes (Signed)
Daily Session Note  Patient Details  Name: PEPE MINEAU MRN: [id number] Date of Birth: July 16, 1940 Referring Provider:     Pulmonary Rehab from 04/08/2019 in [facility] Cardiac and Pulmonary Rehab  Referring Provider  Ancil Linsey MD      Encounter Date: 04/23/2019  Check In: Session Check In - 04/23/19 0953      Check-In   Supervising physician immediately available to respond to emergencies  See telemetry face sheet for immediately available ER MD    Location  [facility]-Cardiac & Pulmonary Rehab    Staff Present  Darel Hong, RN BSN;Jeanna Durrell BS, Exercise Physiologist;Melissa Caiola RDN, LDN    Virtual Visit  No    Medication changes reported      No    Fall or balance concerns reported     No    Warm-up and Cool-down  Performed on first and last piece of equipment    Resistance Training Performed  Yes    VAD Patient?  No    PAD/SET Patient?  No      Pain Assessment   Currently in Pain?  No/denies          Social History   Tobacco Use  Smoking Status Never Smoker  Smokeless Tobacco Current User  . Types: Chew    Goals Met:  Proper associated with RPD/PD & O2 Sat Independence with exercise equipment Using PLB without cueing & demonstrates good technique Exercise tolerated well Strength training completed today  Goals Unmet:  Not Applicable  Comments: Pt able to follow exercise prescription today without complaint.  Will continue to monitor for progression.    Dr. Emily Filbert is Medical Director for [place] and [facility].

## 2019-04-25 ENCOUNTER — Encounter: Payer: Medicare Other | Admitting: *Deleted

## 2019-04-25 ENCOUNTER — Other Ambulatory Visit: Payer: Self-pay

## 2019-04-25 DIAGNOSIS — J8410 Pulmonary fibrosis, unspecified: Secondary | ICD-10-CM

## 2019-04-25 NOTE — Progress Notes (Signed)
Daily Session Note  Patient Details  Name: Louis Gomez MRN: [id number] Date of Birth: 04-19-1941 Referring Provider:     Pulmonary Rehab from 04/08/2019 in [facility] Cardiac and Pulmonary Rehab  Referring Provider  Ancil Linsey MD      Encounter Date: 04/25/2019  Check In: Session Check In - 04/25/19 1102      Check-In   Supervising physician immediately available to respond to emergencies  See telemetry face sheet for immediately available ER MD    Location  [facility]-Cardiac & Pulmonary Rehab    Staff Present  Heath Lark, RN, BSN, CCRP;Joseph Hood RCP,RRT,BSRT;Amanda Oletta Darter, [place], ACSM CEP, Exercise Physiologist    Virtual Visit  No    Medication changes reported      No    Fall or balance concerns reported     No    Warm-up and Cool-down  Performed on first and last piece of equipment    Resistance Training Performed  Yes    VAD Patient?  No    PAD/SET Patient?  No      Pain Assessment   Currently in Pain?  No/denies          Social History   Tobacco Use  Smoking Status Never Smoker  Smokeless Tobacco Current User  . Types: Chew    Goals Met:  Proper associated with RPD/PD & O2 Sat Independence with exercise equipment Exercise tolerated well No report of cardiac concerns or symptoms  Goals Unmet:  Not Applicable  Comments: Pt able to follow exercise prescription today without complaint.  Will continue to monitor for progression.    Dr. Emily Filbert is Medical Director for [place] and [facility].

## 2019-04-28 ENCOUNTER — Encounter: Payer: Medicare Other | Admitting: *Deleted

## 2019-04-28 ENCOUNTER — Other Ambulatory Visit: Payer: Self-pay

## 2019-04-28 DIAGNOSIS — J8410 Pulmonary fibrosis, unspecified: Secondary | ICD-10-CM

## 2019-04-28 NOTE — Progress Notes (Signed)
Daily Session Note  Patient Details  Name: STEPFON RAWLES MRN: [id number] Date of Birth: [birth year] Referring Provider:     Pulmonary Rehab from 04/08/2019 in [facility] Cardiac and Pulmonary Rehab  Referring Provider  Ancil Linsey MD      Encounter Date: 04/28/2019  Check In: Session Check In - 04/28/19 1053      Check-In   Supervising physician immediately available to respond to emergencies  See telemetry face sheet for immediately available ER MD    Location  [facility]-Cardiac & Pulmonary Rehab    Staff Present  Renita Papa, RN Moises Blood, BS, ACSM CEP, Exercise Physiologist;Joseph Tessie Fass RCP,RRT,BSRT    Virtual Visit  No    Medication changes reported      No    Fall or balance concerns reported     No    Warm-up and Cool-down  Performed on first and last piece of equipment    Resistance Training Performed  Yes    VAD Patient?  No    PAD/SET Patient?  No      Pain Assessment   Currently in Pain?  No/denies          Social History   Tobacco Use  Smoking Status Never Smoker  Smokeless Tobacco Current User  . Types: Chew    Goals Met:  Independence with exercise equipment Exercise tolerated well No report of cardiac concerns or symptoms Strength training completed today  Goals Unmet:  Not Applicable  Comments: Pt able to follow exercise prescription today without complaint.  Will continue to monitor for progression.    Dr. Emily Filbert is Medical Director for [place] and [facility].

## 2019-04-30 ENCOUNTER — Other Ambulatory Visit: Payer: Self-pay

## 2019-04-30 ENCOUNTER — Encounter: Payer: Medicare Other | Admitting: *Deleted

## 2019-04-30 DIAGNOSIS — J8410 Pulmonary fibrosis, unspecified: Secondary | ICD-10-CM

## 2019-04-30 NOTE — Progress Notes (Signed)
Daily Session Note  Patient Details  Name: Louis Gomez MRN: [id number] Date of Birth: Aug 15, 1940 Referring Provider:     Pulmonary Rehab from 04/08/2019 in [facility] Cardiac and Pulmonary Rehab  Referring Provider  Ancil Linsey MD      Encounter Date: 04/30/2019  Check In: Session Check In - 04/30/19 1014      Check-In   Supervising physician immediately available to respond to emergencies  See telemetry face sheet for immediately available ER MD    Location  [facility]-Cardiac & Pulmonary Rehab    Staff Present  Heath Lark, RN, BSN, CCRP;Joseph Hood RCP,RRT,BSRT;Jeanna Durrell BS, Exercise Physiologist    Virtual Visit  No    Medication changes reported      No    Fall or balance concerns reported     No    Warm-up and Cool-down  Performed on first and last piece of equipment    Resistance Training Performed  Yes    VAD Patient?  No    PAD/SET Patient?  No      Pain Assessment   Currently in Pain?  No/denies          Social History   Tobacco Use  Smoking Status Never Smoker  Smokeless Tobacco Current User  . Types: Chew    Goals Met:  Proper associated with RPD/PD & O2 Sat Independence with exercise equipment Exercise tolerated well No report of cardiac concerns or symptoms  Goals Unmet:  Not Applicable  Comments: Pt able to follow exercise prescription today without complaint.  Will continue to monitor for progression.    Dr. Emily Filbert is Medical Director for [place] and [facility].

## 2019-05-02 ENCOUNTER — Encounter: Payer: Medicare Other | Admitting: *Deleted

## 2019-05-02 ENCOUNTER — Other Ambulatory Visit: Payer: Self-pay

## 2019-05-02 DIAGNOSIS — J8410 Pulmonary fibrosis, unspecified: Secondary | ICD-10-CM

## 2019-05-02 NOTE — Progress Notes (Signed)
Daily Session Note  Patient Details  Name: Louis Gomez MRN: [id number] Date of Birth: [birth year] Referring Provider:     Pulmonary Rehab from 04/08/2019 in [facility] Cardiac and Pulmonary Rehab  Referring Provider  Ancil Linsey MD      Encounter Date: 05/02/2019  Check In: Session Check In - 05/02/19 1100      Check-In   Supervising physician immediately available to respond to emergencies  See telemetry face sheet for immediately available ER MD    Location  [facility]-Cardiac & Pulmonary Rehab    Staff Present  Renita Papa, RN BSN;Jessica [place], MA, RCEP, CCRP, CCET;Amanda Sommer, [place], ACSM CEP, Exercise Physiologist    Virtual Visit  No    Medication changes reported      No    Fall or balance concerns reported     No    Warm-up and Cool-down  Performed on first and last piece of equipment    Resistance Training Performed  Yes    VAD Patient?  No    PAD/SET Patient?  No      Pain Assessment   Currently in Pain?  No/denies          Social History   Tobacco Use  Smoking Status Never Smoker  Smokeless Tobacco Current User  . Types: Chew    Goals Met:  Independence with exercise equipment Exercise tolerated well No report of cardiac concerns or symptoms Strength training completed today  Goals Unmet:  Not Applicable  Comments: Pt able to follow exercise prescription today without complaint.  Will continue to monitor for progression.    Dr. Emily Filbert is Medical Director for [place] and [facility].

## 2019-05-05 ENCOUNTER — Encounter: Payer: Medicare Other | Admitting: *Deleted

## 2019-05-05 ENCOUNTER — Other Ambulatory Visit: Payer: Self-pay

## 2019-05-05 DIAGNOSIS — J8410 Pulmonary fibrosis, unspecified: Secondary | ICD-10-CM | POA: Diagnosis not present

## 2019-05-05 NOTE — Progress Notes (Signed)
Daily Session Note  Patient Details  Name: Louis Gomez MRN: [id number] Date of Birth: [birth year] Referring Provider:     Pulmonary Rehab from 04/08/2019 in [facility] Cardiac and Pulmonary Rehab  Referring Provider  Ancil Linsey MD      Encounter Date: 05/05/2019  Check In: Session Check In - 05/05/19 1039      Check-In   Supervising physician immediately available to respond to emergencies  See telemetry face sheet for immediately available ER MD    Location  [facility]-Cardiac & Pulmonary Rehab    Staff Present  Renita Papa, RN BSN;Jessica [place], MA, RCEP, CCRP, [place], BS, ACSM CEP, Exercise Physiologist    Virtual Visit  No    Medication changes reported      No    Fall or balance concerns reported     No    Warm-up and Cool-down  Performed on first and last piece of equipment    Resistance Training Performed  Yes    VAD Patient?  No    PAD/SET Patient?  No      Pain Assessment   Currently in Pain?  No/denies          Social History   Tobacco Use  Smoking Status Never Smoker  Smokeless Tobacco Current User  . Types: Chew    Goals Met:  Independence with exercise equipment Exercise tolerated well No report of cardiac concerns or symptoms Strength training completed today  Goals Unmet:  Not Applicable  Comments: Pt able to follow exercise prescription today without complaint.  Will continue to monitor for progression.    Dr. Emily Filbert is Medical Director for [place] and [facility].

## 2019-05-07 ENCOUNTER — Other Ambulatory Visit: Payer: Self-pay

## 2019-05-07 ENCOUNTER — Encounter: Payer: Medicare Other | Admitting: *Deleted

## 2019-05-07 DIAGNOSIS — J8410 Pulmonary fibrosis, unspecified: Secondary | ICD-10-CM

## 2019-05-07 NOTE — Progress Notes (Signed)
Daily Session Note  Patient Details  Name: Louis Gomez MRN: [id number] Date of Birth: 1941/03/08 Referring Provider:     Pulmonary Rehab from 04/08/2019 in [facility] Cardiac and Pulmonary Rehab  Referring Provider  Ancil Linsey MD      Encounter Date: 05/07/2019      Social History   Tobacco Use  Smoking Status Never Smoker  Smokeless Tobacco Current User  . Types: Chew    Goals Met:  Proper associated with RPD/PD & O2 Sat  Goals Unmet:  HR  Comments:  HR up 140 at start of warm up Arrival HR was 110. Stopped Schneider and resolved to 128. Looking at chart he has not had HR this high in past. Placed on tele monitor and A FIb seen. Called MD office to report and get direction for care. Rube actually had appointment this AM with MD. Was advised to send him to office now. Escorted to office.  Dr. Emily Filbert is Medical Director for [place] and [facility].

## 2019-05-13 ENCOUNTER — Telehealth: Payer: Self-pay

## 2019-05-13 NOTE — Telephone Encounter (Signed)
LMOM about schedule changes

## 2019-05-19 ENCOUNTER — Ambulatory Visit: Payer: Medicare Other

## 2019-05-21 ENCOUNTER — Encounter: Payer: Self-pay | Admitting: *Deleted

## 2019-05-21 DIAGNOSIS — J8410 Pulmonary fibrosis, unspecified: Secondary | ICD-10-CM

## 2019-05-21 NOTE — Progress Notes (Signed)
Pulmonary Individual Treatment Plan  Patient Details  Name: Louis Gomez MRN: [id number] Date of Birth: Mar 22, 1941 Referring Provider:     Pulmonary Rehab from 04/08/2019 in [facility] Cardiac and Pulmonary Rehab  Referring Provider  Ancil Linsey MD      Initial Encounter Date:    Pulmonary Rehab from 04/08/2019 in [facility] Cardiac and Pulmonary Rehab  Date  04/08/19      Visit Diagnosis: Pulmonary fibrosis ([place])  Patient's Home Medications on Admission:  Current Outpatient Medications:  .  aspirin EC 81 MG tablet, Take by mouth., Disp: , Rfl:  .  atorvastatin (LIPITOR) 40 MG tablet, , Disp: , Rfl:  .  azelastine (ASTELIN) 0.1 % nasal spray, Place into the nose., Disp: , Rfl:  .  Multiple Vitamin (MULTIVITAMIN) capsule, Take by mouth., Disp: , Rfl:   Past Medical History: No past medical history on file.  Tobacco Use: Social History   Tobacco Use  Smoking Status Never Smoker  Smokeless Tobacco Current User  . Types: Chew    Labs: Recent Review Flowsheet Data    There is no flowsheet data to display.       Pulmonary Assessment Scores: Pulmonary Assessment Scores    Row Name 04/08/19 1318         ADL UCSD   ADL Phase  Entry     SOB Score total  17     Rest  0     Walk  1     Stairs  2     Bath  0     Dress  1     Shop  0       CAT Score   CAT Score  6       mMRC Score   mMRC Score  2        UCSD: Self-administered rating of dyspnea associated with activities of daily living (ADLs) 6-point scale (0 = "not at all" to 5 = "maximal or unable to do because of breathlessness")  Scoring Scores range from 0 to 120.  Minimally important difference is 5 units  CAT: CAT can identify the health impairment of COPD patients and is better correlated with disease progression.  CAT has a scoring range of zero to 40. The CAT score is classified into four groups of low (less than 10), medium (10 - 20), high (21-30) and very high (31-40) based on the impact level of  disease on health status. A CAT score over 10 suggests significant symptoms.  A worsening CAT score could be explained by an exacerbation, poor medication adherence, poor inhaler technique, or progression of COPD or comorbid conditions.  CAT MCID is 2 points  mMRC: mMRC (Modified Medical Research Council) Dyspnea Scale is used to assess the degree of baseline functional disability in patients of respiratory disease due to dyspnea. No minimal important difference is established. A decrease in score of 1 point or greater is considered a positive change.   Pulmonary Function Assessment:   Exercise Target Goals: Exercise Program Goal: Individual exercise prescription set using results from initial 6 min walk test and THRR while considering  patient's activity barriers and safety.   Exercise Prescription Goal: Initial exercise prescription builds to 30-45 minutes a day of aerobic activity, 2-3 days per week.  Home exercise guidelines will be given to patient during program as part of exercise prescription that the participant will acknowledge.  Activity Barriers & Risk Stratification: Activity Barriers & Cardiac Risk Stratification - 04/08/19 1303  Activity Barriers & Cardiac Risk Stratification   Activity Barriers  Deconditioning;Muscular Weakness;Balance Concerns;Shortness of Breath       6 Minute Walk: 6 Minute Walk    Row Name 04/08/19 1301         6 Minute Walk   Phase  Initial     Distance  1135 feet     Walk Time  6 minutes     # of Rest Breaks  0     MPH  2.15     METS  2.45     RPE  14     Perceived Dyspnea   3     VO2 Peak  8.58     Symptoms  Yes (comment)     Comments  SOB     Resting HR  63 bpm     Resting BP  122/64     Resting Oxygen Saturation   94 %     Exercise Oxygen Saturation  during 6 min walk  88 %     Max Ex. HR  103 bpm     Max Ex. BP  146/74     2 Minute Post BP  134/70       Interval HR   1 Minute HR  87     2 Minute HR  99     3 Minute  HR  77     4 Minute HR  62     5 Minute HR  63     6 Minute HR  103     2 Minute Post HR  67     Interval Heart Rate?  Yes       Interval Oxygen   Interval Oxygen?  Yes     Baseline Oxygen Saturation %  94 %     1 Minute Oxygen Saturation %  93 %     1 Minute Liters of Oxygen  0 L Room Air     2 Minute Oxygen Saturation %  92 %     2 Minute Liters of Oxygen  0 L     3 Minute Oxygen Saturation %  91 %     3 Minute Liters of Oxygen  0 L     4 Minute Oxygen Saturation %  97 %     4 Minute Liters of Oxygen  0 L     5 Minute Oxygen Saturation %  89 %     5 Minute Liters of Oxygen  0 L     6 Minute Oxygen Saturation %  88 %     6 Minute Liters of Oxygen  0 L     2 Minute Post Oxygen Saturation %  94 %     2 Minute Post Liters of Oxygen  0 L       Oxygen Initial Assessment: Oxygen Initial Assessment - 04/08/19 1317      Home Oxygen   Home Oxygen Device  None    Sleep Oxygen Prescription  None    Home Exercise Oxygen Prescription  None    Home at Rest Exercise Oxygen Prescription  None      Initial 6 min Walk   Oxygen Used  None      Program Oxygen Prescription   Program Oxygen Prescription  None      Intervention   Short Term Goals  To learn and understand importance of maintaining oxygen saturations>88%;To learn and demonstrate proper pursed lip breathing techniques or other breathing techniques.;To  learn and understand importance of monitoring SPO2 with pulse oximeter and demonstrate accurate use of the pulse oximeter.    Long  Term Goals  Maintenance of O2 saturations>88%;Verbalizes importance of monitoring SPO2 with pulse oximeter and return demonstration;Exhibits proper breathing techniques, such as pursed lip breathing or other method taught during program session       Oxygen Re-Evaluation: Oxygen Re-Evaluation    Row Name 04/11/19 1137             Program Oxygen Prescription   Program Oxygen Prescription  None         Home Oxygen   Home Oxygen Device  None        Sleep Oxygen Prescription  None       Home Exercise Oxygen Prescription  None       Home at Rest Exercise Oxygen Prescription  None         Goals/Expected Outcomes   Short Term Goals  To learn and understand importance of maintaining oxygen saturations>88%;To learn and demonstrate proper pursed lip breathing techniques or other breathing techniques.;To learn and understand importance of monitoring SPO2 with pulse oximeter and demonstrate accurate use of the pulse oximeter.       Long  Term Goals  Maintenance of O2 saturations>88%;Verbalizes importance of monitoring SPO2 with pulse oximeter and return demonstration;Exhibits proper breathing techniques, such as pursed lip breathing or other method taught during program session       Comments  Reviewed PLB technique with pt.  Talked about how it work and it's important to maintaining his exercise saturations.       Goals/Expected Outcomes  Short: Become more profiecient at using PLB.   Long: Become independent at using PLB.          Oxygen Discharge (Final Oxygen Re-Evaluation): Oxygen Re-Evaluation - 04/11/19 1137      Program Oxygen Prescription   Program Oxygen Prescription  None      Home Oxygen   Home Oxygen Device  None    Sleep Oxygen Prescription  None    Home Exercise Oxygen Prescription  None    Home at Rest Exercise Oxygen Prescription  None      Goals/Expected Outcomes   Short Term Goals  To learn and understand importance of maintaining oxygen saturations>88%;To learn and demonstrate proper pursed lip breathing techniques or other breathing techniques.;To learn and understand importance of monitoring SPO2 with pulse oximeter and demonstrate accurate use of the pulse oximeter.    Long  Term Goals  Maintenance of O2 saturations>88%;Verbalizes importance of monitoring SPO2 with pulse oximeter and return demonstration;Exhibits proper breathing techniques, such as pursed lip breathing or other method taught during program  session    Comments  Reviewed PLB technique with pt.  Talked about how it work and it's important to maintaining his exercise saturations.    Goals/Expected Outcomes  Short: Become more profiecient at using PLB.   Long: Become independent at using PLB.       Initial Exercise Prescription: Initial Exercise Prescription - 04/08/19 1300      Date of Initial Exercise RX and Referring Provider   Date  04/08/19    Referring Provider  Ancil Linsey MD      Treadmill   MPH  1.9    Grade  0.5    Minutes  15    METs  2.59      Recumbant Bike   Level  1    RPM  50    Watts  10    Minutes  15    METs  2.5      NuStep   Level  1    SPM  80    Minutes  15    METs  2.5      Recumbant Elliptical   Level  1    RPM  50    Minutes  15    METs  2.5      Prescription Details   Frequency (times per week)  3    Duration  Progress to 30 minutes of continuous aerobic without signs/symptoms of physical distress      Intensity   THRR 40-80% of Max Heartrate  95-127    Ratings of Perceived Exertion  11-13    Perceived Dyspnea  0-4      Progression   Progression  Continue to progress workloads to maintain intensity without signs/symptoms of physical distress.      Resistance Training   Training Prescription  Yes    Weight  3 lb    Reps  10-15       Perform Capillary Blood Glucose checks as needed.  Exercise Prescription Changes: Exercise Prescription Changes    Row Name 04/08/19 1300 04/22/19 1000 04/25/19 1200 05/07/19 1200       Response to Exercise   Blood Pressure (Admit)  122/64  126/74  --  134/72    Blood Pressure (Exercise)  142/64  146/80  --  154/70    Blood Pressure (Exit)  134/70  106/64  --  120/60    Heart Rate (Admit)  63 bpm  69 bpm  --  66 bpm    Heart Rate (Exercise)  103 bpm  107 bpm  --  110 bpm    Heart Rate (Exit)  64 bpm  68 bpm  --  71 bpm    Oxygen Saturation (Admit)  94 %  95 %  --  94 %    Oxygen Saturation (Exercise)  88 %  89 %  --  84 %     Oxygen Saturation (Exit)  94 %  92 %  --  95 %    Rating of Perceived Exertion (Exercise)  14  12  --  15    Perceived Dyspnea (Exercise)  3  3  --  2    Symptoms  SOB  --  --  SOB, low sats on TM    Comments  walk test results  --  --  --    Duration  --  Continue with 30 min of aerobic exercise without signs/symptoms of physical distress.  --  Continue with 30 min of aerobic exercise without signs/symptoms of physical distress.    Intensity  --  THRR unchanged  --  THRR unchanged      Progression   Progression  --  Continue to progress workloads to maintain intensity without signs/symptoms of physical distress.  --  Continue to progress workloads to maintain intensity without signs/symptoms of physical distress.    Average METs  --  2.7  --  2.76      Resistance Training   Training Prescription  --  Yes  --  Yes    Weight  --  3 lb  --  3 lb    Reps  --  10-15  --  10-15      Interval Training   Interval Training  --  --  --  No      Treadmill  MPH  --  --  --  1.9    Grade  --  --  --  0.5    Minutes  --  --  --  15    METs  --  --  --  2.59      NuStep   Level  --  3  --  5    SPM  --  80  --  --    Minutes  --  15  --  15    METs  --  4.1  --  3.7      Recumbant Elliptical   Level  --  1  --  1.5    RPM  --  50  --  --    Minutes  --  15  --  15    METs  --  1.8  --  2      Home Exercise Plan   Plans to continue exercise at  --  --  Home (comment)  Home (comment)    Frequency  --  --  Add 1 additional day to program exercise sessions.  Add 1 additional day to program exercise sessions.    Initial Home Exercises Provided  --  --  04/25/19  04/25/19       Exercise Comments: Exercise Comments    Row Name 05/07/19 1025           Exercise Comments  HR up 140 at start of warm up Arrival HR was 110. Stopped [place] and resolved to 128. Looking at chart he has not had HR this high in past. Placed on tele monitor and A FIb seen. Called MD office to report and get  direction for care. Lawayne actually had appointment this AM with MD. Was advised to send him to office now. Escorted to office.          Exercise Goals and Review: Exercise Goals    Row Name 04/08/19 1312             Exercise Goals   Increase Physical Activity  Yes       Intervention  Provide advice, education, support and counseling about physical activity/exercise needs.;Develop an individualized exercise prescription for aerobic and resistive training based on initial evaluation findings, risk stratification, comorbidities and participant's personal goals.       Expected Outcomes  Short Term: Attend rehab on a regular basis to increase amount of physical activity.;Long Term: Exercising regularly at least 3-5 days a week.;Long Term: Add in home exercise to make exercise part of routine and to increase amount of physical activity.       Increase Strength and Stamina  Yes       Intervention  Provide advice, education, support and counseling about physical activity/exercise needs.;Develop an individualized exercise prescription for aerobic and resistive training based on initial evaluation findings, risk stratification, comorbidities and participant's personal goals.       Expected Outcomes  Short Term: Increase workloads from initial exercise prescription for resistance, speed, and METs.;Short Term: Perform resistance training exercises routinely during rehab and add in resistance training at home;Long Term: Improve cardiorespiratory fitness, muscular endurance and strength as measured by increased METs and functional capacity (6MWT)       Able to understand and use rate of perceived exertion (RPE) scale  Yes       Intervention  Provide education and explanation on how to use RPE scale       Expected Outcomes  Short  Term: Able to use RPE daily in rehab to express subjective intensity level;Long Term:  Able to use RPE to guide intensity level when exercising independently       Able to  understand and use Dyspnea scale  Yes       Intervention  Provide education and explanation on how to use Dyspnea scale       Expected Outcomes  Short Term: Able to use Dyspnea scale daily in rehab to express subjective sense of shortness of breath during exertion;Long Term: Able to use Dyspnea scale to guide intensity level when exercising independently       Knowledge and understanding of Target Heart Rate Range (THRR)  Yes       Intervention  Provide education and explanation of THRR including how the numbers were predicted and where they are located for reference       Expected Outcomes  Short Term: Able to state/look up THRR;Long Term: Able to use THRR to govern intensity when exercising independently;Short Term: Able to use daily as guideline for intensity in rehab       Able to check pulse independently  Yes       Intervention  Provide education and demonstration on how to check pulse in carotid and radial arteries.;Review the importance of being able to check your own pulse for safety during independent exercise       Expected Outcomes  Short Term: Able to explain why pulse checking is important during independent exercise;Long Term: Able to check pulse independently and accurately       Understanding of Exercise Prescription  Yes       Intervention  Provide education, explanation, and written materials on patient's individual exercise prescription       Expected Outcomes  Short Term: Able to explain program exercise prescription;Long Term: Able to explain home exercise prescription to exercise independently          Exercise Goals Re-Evaluation : Exercise Goals Re-Evaluation    Row Name 04/11/19 1134 04/22/19 1008 04/25/19 1205 05/07/19 1240       Exercise Goal Re-Evaluation   Exercise Goals Review  Increase Physical Activity;Able to understand and use rate of perceived exertion (RPE) scale;Knowledge and understanding of Target Heart Rate Range (THRR);Understanding of Exercise  Prescription;Increase Strength and Stamina;Able to understand and use Dyspnea scale;Able to check pulse independently  Increase Physical Activity;Increase Strength and Stamina;Able to understand and use rate of perceived exertion (RPE) scale;Able to understand and use Dyspnea scale;Knowledge and understanding of Target Heart Rate Range (THRR);Able to check pulse independently;Understanding of Exercise Prescription  Increase Physical Activity;Increase Strength and Stamina;Able to understand and use rate of perceived exertion (RPE) scale;Able to understand and use Dyspnea scale;Knowledge and understanding of Target Heart Rate Range (THRR);Able to check pulse independently;Understanding of Exercise Prescription  Increase Physical Activity;Increase Strength and Stamina;Understanding of Exercise Prescription    Comments  Reviewed RPE scale, THR and program prescription with pt today.  Pt voiced understanding and was given a copy of goals to take home.  Kamaal is doing well in rehab.  He has moved up level on T4.  Oxygen stays above 88 % during exercise.  Reviewed home exercise with pt today.  Pt plans to  for exercise.  Reviewed THR, pulse, RPE, sign and symptoms, NTG use, and when to call 911 or MD.  Also discussed weather considerations and indoor options.  Pt voiced understanding.  Karas has been doing well in rehab.  He is now up to level  5 on the NuStep adn 1.5 on the recumbent elliptical.  His oxygen levels have be dropping on the treadmill and we will continue to keep an eye on them. Today, his exericse was stopped due to a high HR and afib. He was sent to MD office for review.  We will continue to monitor his progress.    Expected Outcomes  Short: Use RPE daily to regulate intensity. Long: Follow program prescription in THR.  Short - continue to attend consistently Long -  increase overall stamina  Short - exercise at home on days not at [place] - improve overall MET level  Short: Continue to monitor oxygen  saturations on treadmill. Long: Continue to improve stamina.       Discharge Exercise Prescription (Final Exercise Prescription Changes): Exercise Prescription Changes - 05/07/19 1200      Response to Exercise   Blood Pressure (Admit)  134/72    Blood Pressure (Exercise)  154/70    Blood Pressure (Exit)  120/60    Heart Rate (Admit)  66 bpm    Heart Rate (Exercise)  110 bpm    Heart Rate (Exit)  71 bpm    Oxygen Saturation (Admit)  94 %    Oxygen Saturation (Exercise)  84 %    Oxygen Saturation (Exit)  95 %    Rating of Perceived Exertion (Exercise)  15    Perceived Dyspnea (Exercise)  2    Symptoms  SOB, low sats on TM    Duration  Continue with 30 min of aerobic exercise without signs/symptoms of physical distress.    Intensity  THRR unchanged      Progression   Progression  Continue to progress workloads to maintain intensity without signs/symptoms of physical distress.    Average METs  2.76      Resistance Training   Training Prescription  Yes    Weight  3 lb    Reps  10-15      Interval Training   Interval Training  No      Treadmill   MPH  1.9    Grade  0.5    Minutes  15    METs  2.59      NuStep   Level  5    Minutes  15    METs  3.7      Recumbant Elliptical   Level  1.5    Minutes  15    METs  2      Home Exercise Plan   Plans to continue exercise at  Home (comment)    Frequency  Add 1 additional day to program exercise sessions.    Initial Home Exercises Provided  04/25/19       Nutrition:  Target Goals: Understanding of nutrition guidelines, daily intake of sodium <1577m, cholesterol <2068m calories 30% from fat and 7% or less from saturated fats, daily to have 5 or more servings of fruits and vegetables.  Biometrics: Pre Biometrics - 04/08/19 1313      Pre Biometrics   Height  5' 6" (1.676 m)    Weight  159 lb 8 oz (72.3 kg)    BMI (Calculated)  25.76    Single Leg Stand  1.63 seconds        Nutrition Therapy Plan and Nutrition  Goals: Nutrition Therapy & Goals - 04/22/19 1202      Nutrition Therapy   Diet  Low Na, HH diet    Protein (specify units)  55-60g  Fiber  30 grams    Whole Grain Foods  3 servings    Saturated Fats  12 max. grams    Fruits and Vegetables  5 servings/day    Sodium  1.5 grams      Personal Nutrition Goals   Nutrition Goal  ST: continue current diet LT: Improve breathing and reduce SOB.    Comments  B: coffee (sugar + half n half) + bowl cereal (raisin bran w/ whole milk) or sausage/ham biscuit. D: sandwich (meat, cheese, on whole wheat) pepsi and sometimes chips and snack cakes or salad or leftovers from family or TV dinners. water during the day, doesn't drink as much since it got cold. Pt reports not wanting to make any changes. Discussed HH eating and making sure getting enough kcal and protein. Pt reports being weight stable, but energy is low. Will continue to monitor, pt does not want to make any chanegs at this time.      Intervention Plan   Intervention  Prescribe, educate and counsel regarding individualized specific dietary modifications aiming towards targeted core components such as weight, hypertension, lipid management, diabetes, heart failure and other comorbidities.;Nutrition handout(s) given to patient.    Expected Outcomes  Short Term Goal: Understand basic principles of dietary content, such as calories, fat, sodium, cholesterol and nutrients.;Short Term Goal: A plan has been developed with personal nutrition goals set during dietitian appointment.;Long Term Goal: Adherence to prescribed nutrition plan.       Nutrition Assessments: Nutrition Assessments - 04/08/19 1138      MEDFICTS Scores   Pre Score  95       Nutrition Goals Re-Evaluation:   Nutrition Goals Discharge (Final Nutrition Goals Re-Evaluation):   Psychosocial: Target Goals: Acknowledge presence or absence of significant depression and/or stress, maximize coping skills, provide positive support  system. Participant is able to verbalize types and ability to use techniques and skills needed for reducing stress and depression.   Initial Review & Psychosocial Screening: Initial Psych Review & Screening - 04/04/19 1308      Initial Review   Current issues with  Current Stress Concerns    Source of Stress Concerns  Financial      Family Dynamics   Good Support System?  Yes      Barriers   Psychosocial barriers to participate in program  There are no identifiable barriers or psychosocial needs.      Screening Interventions   Interventions  Encouraged to exercise;To provide support and resources with identified psychosocial needs;Provide feedback about the scores to participant    Expected Outcomes  Short Term goal: Utilizing psychosocial counselor, staff and physician to assist with identification of specific Stressors or current issues interfering with healing process. Setting desired goal for each stressor or current issue identified.;Long Term Goal: Stressors or current issues are controlled or eliminated.;Short Term goal: Identification and review with participant of any Quality of Life or Depression concerns found by scoring the questionnaire.;Long Term goal: The participant improves quality of Life and PHQ9 Scores as seen by post scores and/or verbalization of changes       Quality of Life Scores:  Scores of 19 and below usually indicate a poorer quality of life in these areas.  A difference of  2-3 points is a clinically meaningful difference.  A difference of 2-3 points in the total score of the Quality of Life Index has been associated with significant improvement in overall quality of life, self-image, physical symptoms, and general health in studies  assessing change in quality of life.  PHQ-9: Recent Review Flowsheet Data    Depression screen [facility] 2/9 04/08/2019   Decreased Interest 0   Down, Depressed, Hopeless 0   PHQ - 2 Score 0   Altered sleeping 0   Tired, decreased  energy 1   Change in appetite 1   Feeling bad or failure about yourself  0   Trouble concentrating 0   Moving slowly or fidgety/restless 0   Suicidal thoughts 0   PHQ-9 Score 2   Difficult doing work/chores Not difficult at all     Interpretation of Total Score  Total Score Depression Severity:  1-4 = Minimal depression, 5-9 = Mild depression, 10-14 = Moderate depression, 15-19 = Moderately severe depression, 20-27 = Severe depression   Psychosocial Evaluation and Intervention: Psychosocial Evaluation - 04/04/19 1355      Psychosocial Evaluation & Interventions   Interventions  Stress management education;Encouraged to exercise with the program and follow exercise prescription    Comments  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 He hopes this program helps some with his stamina. He does have someone that helps out with some of the farm duties, but paying him is difficult sometimes. His girlfriend is very supportive and his son lives down the road from him if he needs anything    Expected Outcomes  Short: attend [facility] to increase stamina Long: develop self care habits.    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    Expected Outcomes  Short - continue to attend consistently Long - manage stress on his own       Education: Education Goals: Education classes will be provided on a weekly basis, covering required topics. Participant will state understanding/return demonstration of topics presented.  Learning Barriers/Preferences: Learning Barriers/Preferences - 04/04/19 1308      Learning Barriers/Preferences   Learning Barriers  None    Learning Preferences  None       Education Topics:  Initial Evaluation Education: - Verbal, written and demonstration of respiratory meds, oximetry and breathing techniques. Instruction on use of nebulizers and MDIs and importance of monitoring MDI activations.   Pulmonary Rehab from 04/23/2019 in [facility] Cardiac and Pulmonary Rehab  Date  04/08/19  Educator  [facility]  Instruction Review Code  1- Verbalizes Understanding      General Nutrition Guidelines/Fats and Fiber: -Group instruction provided by verbal, written material, models  and posters to present the general guidelines for heart healthy nutrition. Gives an explanation and review of dietary fats and fiber.   Controlling Sodium/Reading Food Labels: -Group verbal and written material supporting the discussion of sodium use in heart healthy nutrition. Review and explanation with models, verbal and written materials for utilization of the food label.   Exercise Physiology & General Exercise Guidelines: - Group verbal and written instruction with models to review the exercise physiology of the cardiovascular system and associated critical values. Provides general exercise guidelines with specific guidelines to those with heart or lung disease.    Aerobic Exercise & Resistance Training: - Gives group verbal and written instruction on the various components of exercise. Focuses on aerobic and resistive training programs and the  benefits of this training and how to safely progress through these programs.   Flexibility, Balance, Mind/Body Relaxation: Provides group verbal/written instruction on the benefits of flexibility and balance training, including mind/body exercise modes such as yoga, pilates and tai chi.  Demonstration and skill practice provided.   Pulmonary Rehab from 04/23/2019 in [facility] Cardiac and Pulmonary Rehab  Date  04/23/19  Educator  [facility]  Instruction Review Code  1- Verbalizes Understanding      Stress and Anxiety: - Provides group verbal and written instruction about the health risks of elevated stress and causes of high stress.  Discuss the correlation between heart/lung disease and anxiety and treatment options. Review healthy ways to manage with stress and anxiety.   Depression: - Provides group verbal and written instruction on the correlation between heart/lung disease and depressed mood, treatment options, and the stigmas associated with seeking treatment.   Exercise & Equipment Safety: - Individual verbal instruction and demonstration of equipment use and safety with use of the equipment.   Pulmonary Rehab from 04/23/2019 in [facility] Cardiac and Pulmonary Rehab  Date  04/08/19  Educator  [facility]  Instruction Review Code  1- Verbalizes Understanding      Infection Prevention: - Provides verbal and written material to individual with discussion of infection control including proper hand washing and proper equipment cleaning during exercise session.   Pulmonary Rehab from 04/23/2019 in [facility] Cardiac and Pulmonary Rehab  Date  04/08/19  Educator  [facility]  Instruction Review Code  1- Verbalizes Understanding      Falls Prevention: - Provides verbal and written material to individual with discussion of falls prevention and safety.   Pulmonary Rehab from 04/23/2019 in [facility] Cardiac and Pulmonary Rehab  Date  04/08/19  Educator  [facility]  Instruction Review Code  1- Verbalizes Understanding       Diabetes: - Individual verbal and written instruction to review signs/symptoms of diabetes, desired ranges of glucose level fasting, after meals and with exercise. Advice that pre and post exercise glucose checks will be done for 3 sessions at entry of program.   Chronic Lung Diseases: - Group verbal and written instruction to review updates, respiratory medications, advancements in procedures and treatments. Discuss use of supplemental oxygen including available portable oxygen systems, continuous and intermittent flow rates, concentrators, personal use and safety guidelines. Review proper use of inhaler and spacers. Provide informative websites for self-education.    Pulmonary Rehab from 04/23/2019 in [facility] Cardiac and Pulmonary Rehab  Date  04/23/19  Educator  [facility]  Instruction Review Code  1- Verbalizes Understanding      Energy Conservation: - Provide group verbal and written instruction for methods to conserve energy, plan and organize activities. Instruct on pacing techniques, use  of adaptive equipment and posture/positioning to relieve shortness of breath.   Triggers and Exacerbations: - Group verbal and written instruction to review types of environmental triggers and ways to prevent exacerbations. Discuss weather changes, air quality and the benefits of nasal washing. Review warning signs and symptoms to help prevent infections. Discuss techniques for effective airway clearance, coughing, and vibrations.   AED/CPR: - Group verbal and written instruction with the use of models to demonstrate the basic use of the AED with the basic ABC's of resuscitation.   Anatomy and Physiology of the Lungs: - Group verbal and written instruction with the use of models to provide basic lung anatomy and physiology related to function, structure and complications of lung disease.   Anatomy & Physiology of the Heart: - Group verbal and written instruction and models provide basic cardiac  anatomy and physiology, with the coronary electrical and arterial systems. Review of Valvular disease and Heart Failure   Cardiac Medications: - Group verbal and written instruction to review commonly prescribed medications for heart disease. Reviews the medication, class of the drug, and side effects.   Know Your Numbers and Risk Factors: -Group verbal and written instruction about important numbers in your health.  Discussion of what are risk factors and how they play a role in the disease process.  Review of Cholesterol, Blood Pressure, Diabetes, and BMI and the role they play in your overall health.   Sleep Hygiene: -Provides group verbal and written instruction about how sleep can affect your health.  Define sleep hygiene, discuss sleep cycles and impact of sleep habits. Review good sleep hygiene tips.    Other: -Provides group and verbal instruction on various topics (see comments)    Knowledge Questionnaire Score: Knowledge Questionnaire Score - 04/08/19 1314      Knowledge Questionnaire Score   Pre Score  13/16 Education Focus: Exercise, PLB, oxygen safety        Core Components/Risk Factors/Patient Goals at Admission: Personal Goals and Risk Factors at Admission - 04/08/19 1315      Core Components/Risk Factors/Patient Goals on Admission    Weight Management  Yes;Weight Loss    Intervention  Weight Management: Develop a combined nutrition and exercise program designed to reach desired caloric intake, while maintaining appropriate intake of nutrient and fiber, sodium and fats, and appropriate energy expenditure required for the weight goal.;Weight Management: Provide education and appropriate resources to help participant work on and attain dietary goals.    Admit Weight  159 lb 8 oz (72.3 kg)    Goal Weight: Short Term  154 lb (69.9 kg)    Goal Weight: Long Term  150 lb (68 kg)    Expected Outcomes  Short Term: Continue to assess and modify interventions until short term  weight is achieved;Long Term: Adherence to nutrition and physical activity/exercise program aimed toward attainment of established weight goal;Weight Loss: Understanding of general recommendations for a balanced deficit meal plan, which promotes 1-2 lb weight loss per week and includes a negative energy balance of [phone] kcal/d;Understanding recommendations for meals to include 15-35% energy as protein, 25-35% energy from fat, 35-60% energy from carbohydrates, less than 23m of dietary cholesterol, 20-35 gm of total fiber daily;Understanding of distribution of calorie intake throughout the day with the consumption of 4-5 meals/snacks    Improve shortness of breath with ADL's  Yes    Intervention  Provide education, individualized exercise plan and daily activity instruction to help decrease symptoms of SOB with activities of daily living.  Expected Outcomes  Short Term: Improve cardiorespiratory fitness to achieve a reduction of symptoms when performing ADLs;Long Term: Be able to perform more ADLs without symptoms or delay the onset of symptoms    Lipids  Yes    Intervention  Provide education and support for participant on nutrition & aerobic/resistive exercise along with prescribed medications to achieve LDL <61m, HDL >494m    Expected Outcomes  Short Term: Participant states understanding of desired cholesterol values and is compliant with medications prescribed. Participant is following exercise prescription and nutrition guidelines.;Long Term: Cholesterol controlled with medications as prescribed, with individualized exercise RX and with personalized nutrition plan. Value goals: LDL < 7041mHDL > 40 mg.       Core Components/Risk Factors/Patient Goals Review:  Goals and Risk Factor Review    Row Name 05/05/19 1110             Core Components/Risk Factors/Patient Goals Review   Personal Goals Review  Weight Management/Obesity;Increase knowledge of respiratory medications and ability to  use respiratory devices properly.;Improve shortness of breath with ADL's       Review  WilRaahil taking all meds as directed.  He feels he can do more ADLs with less shortness of breath.  He hasnt been exercising at home.  He is able to do yard work around his home when the weather is nice.       Expected Outcomes  Short - attend [facility] consistently Long - improve stamina overall          Core Components/Risk Factors/Patient Goals at Discharge (Final Review):  Goals and Risk Factor Review - 05/05/19 1110      Core Components/Risk Factors/Patient Goals Review   Personal Goals Review  Weight Management/Obesity;Increase knowledge of respiratory medications and ability to use respiratory devices properly.;Improve shortness of breath with ADL's    Review  WilAspen taking all meds as directed.  He feels he can do more ADLs with less shortness of breath.  He hasnt been exercising at home.  He is able to do yard work around his home when the weather is nice.    Expected Outcomes  Short - attend [facility] consistently Long - improve stamina overall       ITP Comments: ITP Comments    Row Name 04/04/19 1321 04/08/19 1258 04/11/19 1133 04/22/19 1227 04/23/19 1030   ITP Comments  Initial telephone orientation completed. Diagnosis completed 9/23. EP orientation scheduled for 11/17 at 9:30  Completed 6MWT and gym orientation.  Initial ITP created and sent for review to Dr. MarEmily Filbertedical Director.  First full day of exercise!  Patient was oriented to gym and equipment including functions, settings, policies, and procedures.  Patient's individual exercise prescription and treatment plan were reviewed.  All starting workloads were established based on the results of the 6 minute walk test done at initial orientation visit.  The plan for exercise progression was also introduced and progression will be customized based on patient's performance and goals.  Completed intial RD eval  30 day review competed . ITP sent to  Dr MarEmily Filbertr review, changes as needed and ITP approval signature.   [place] [facility] 05/07/19 1024 05/21/19 0908         ITP Comments  HR up 140 at start of warm up Arrival HR was 110. Stopped WilRiyaand resolved to 128. Looking at chart he has not had HR this high in past. Placed on tele monitor and A FIb seen. Called MD office  to report and get direction for care. Eliezer actually had appointment this AM with MD. Was advised to send him to office now. Escorted to office.  30 day review competed . ITP sent to Dr Emily Filbert for review, changes as needed and ITP approval signature. Lukka called this week to say his physician wants him to stay out of rehab until further notice.         Comments:

## 2019-06-06 ENCOUNTER — Encounter: Payer: Self-pay | Admitting: *Deleted

## 2019-06-06 DIAGNOSIS — J8410 Pulmonary fibrosis, unspecified: Secondary | ICD-10-CM

## 2019-06-16 ENCOUNTER — Telehealth: Payer: Self-pay

## 2019-06-16 DIAGNOSIS — J8410 Pulmonary fibrosis, unspecified: Secondary | ICD-10-CM

## 2019-06-16 NOTE — Telephone Encounter (Signed)
No answer

## 2019-06-17 NOTE — Telephone Encounter (Signed)
Pt has been out for evaluation for afib.  Called to check in, left message.  OV notes show medication change and no follow up for 3 months.  Pt should be able to return.  Out since 05/07/19.

## 2019-06-18 ENCOUNTER — Encounter: Payer: Self-pay | Admitting: *Deleted

## 2019-06-18 DIAGNOSIS — J8410 Pulmonary fibrosis, unspecified: Secondary | ICD-10-CM

## 2019-06-18 NOTE — Progress Notes (Signed)
Pulmonary Individual Treatment Plan  Patient Details  Name: Louis Gomez MRN: [id number] Date of Birth: 1941/02/12 Referring Provider:     Pulmonary Rehab from 04/08/2019 in [facility]  Referring Provider  Ancil Linsey MD      Initial Encounter Date:    Pulmonary Rehab from 04/08/2019 in [facility]  Date  04/08/19      Visit Diagnosis: Pulmonary fibrosis ([place])  Patient's Home Medications on Admission:  Current Outpatient Medications:  .  aspirin EC 81 MG tablet, Take by mouth., Disp: , Rfl:  .  atorvastatin (LIPITOR) 40 MG tablet, , Disp: , Rfl:  .  azelastine (ASTELIN) 0.1 % nasal spray, Place into the nose., Disp: , Rfl:  .  Multiple Vitamin (MULTIVITAMIN) capsule, Take by mouth., Disp: , Rfl:   Past Medical History: No past medical history on file.  Tobacco Use: Social History   Tobacco Use  Smoking Status Never Smoker  Smokeless Tobacco Current User  . Types: Chew    Labs: Recent Review Flowsheet Data    There is no flowsheet data to display.       Pulmonary Assessment Scores: Pulmonary Assessment Scores    Row Name 04/08/19 1318         ADL UCSD   ADL Phase  Entry     SOB Score total  17     Rest  0     Walk  1     Stairs  2     Bath  0     Dress  1     Shop  0       CAT Score   CAT Score  6       mMRC Score   mMRC Score  2        UCSD: Self-administered rating of dyspnea associated with activities of daily living (ADLs) 6-point scale (0 = "not at all" to 5 = "maximal or unable to do because of breathlessness")  Scoring Scores range from 0 to 120.  Minimally important difference is 5 units  CAT: CAT can identify the health impairment of COPD patients and is better correlated with disease progression.  CAT has a scoring range of zero to 40. The CAT score is classified into four groups of low (less than 10), medium (10 - 20), high (21-30) and very high (31-40) based on the impact level of  disease on health status. A CAT score over 10 suggests significant symptoms.  A worsening CAT score could be explained by an exacerbation, poor medication adherence, poor inhaler technique, or progression of COPD or comorbid conditions.  CAT MCID is 2 points  mMRC: mMRC (Modified Medical Research Council) Dyspnea Scale is used to assess the degree of baseline functional disability in patients of respiratory disease due to dyspnea. No minimal important difference is established. A decrease in score of 1 point or greater is considered a positive change.   Pulmonary Function Assessment:   Exercise Target Goals: Exercise Program Goal: Individual exercise prescription set using results from initial 6 min walk test and THRR while considering  patient's activity barriers and safety.   Exercise Prescription Goal: Initial exercise prescription builds to 30-45 minutes a day of aerobic activity, 2-3 days per week.  Home exercise guidelines will be given to patient during program as part of exercise prescription that the participant will acknowledge.  Activity Barriers & Risk Stratification: Activity Barriers & Cardiac Risk Stratification - 04/08/19 1303  Activity Barriers & Cardiac Risk Stratification   Activity Barriers  Deconditioning;Muscular Weakness;Balance Concerns;Shortness of Breath       6 Minute Walk: 6 Minute Walk    Row Name 04/08/19 1301         6 Minute Walk   Phase  Initial     Distance  1135 feet     Walk Time  6 minutes     # of Rest Breaks  0     MPH  2.15     METS  2.45     RPE  14     Perceived Dyspnea   3     VO2 Peak  8.58     Symptoms  Yes (comment)     Comments  SOB     Resting HR  63 bpm     Resting BP  122/64     Resting Oxygen Saturation   94 %     Exercise Oxygen Saturation  during 6 min walk  88 %     Max Ex. HR  103 bpm     Max Ex. BP  146/74     2 Minute Post BP  134/70       Interval HR   1 Minute HR  87     2 Minute HR  99     3 Minute  HR  77     4 Minute HR  62     5 Minute HR  63     6 Minute HR  103     2 Minute Post HR  67     Interval Heart Rate?  Yes       Interval Oxygen   Interval Oxygen?  Yes     Baseline Oxygen Saturation %  94 %     1 Minute Oxygen Saturation %  93 %     1 Minute Liters of Oxygen  0 L Room Air     2 Minute Oxygen Saturation %  92 %     2 Minute Liters of Oxygen  0 L     3 Minute Oxygen Saturation %  91 %     3 Minute Liters of Oxygen  0 L     4 Minute Oxygen Saturation %  97 %     4 Minute Liters of Oxygen  0 L     5 Minute Oxygen Saturation %  89 %     5 Minute Liters of Oxygen  0 L     6 Minute Oxygen Saturation %  88 %     6 Minute Liters of Oxygen  0 L     2 Minute Post Oxygen Saturation %  94 %     2 Minute Post Liters of Oxygen  0 L       Oxygen Initial Assessment: Oxygen Initial Assessment - 04/08/19 1317      Home Oxygen   Home Oxygen Device  None    Sleep Oxygen Prescription  None    Home Exercise Oxygen Prescription  None    Home at Rest Exercise Oxygen Prescription  None      Initial 6 min Walk   Oxygen Used  None      Program Oxygen Prescription   Program Oxygen Prescription  None      Intervention   Short Term Goals  To learn and understand importance of maintaining oxygen saturations>88%;To learn and demonstrate proper pursed lip breathing techniques or other breathing techniques.;To  learn and understand importance of monitoring SPO2 with pulse oximeter and demonstrate accurate use of the pulse oximeter.    Long  Term Goals  Maintenance of O2 saturations>88%;Verbalizes importance of monitoring SPO2 with pulse oximeter and return demonstration;Exhibits proper breathing techniques, such as pursed lip breathing or other method taught during program session       Oxygen Re-Evaluation: Oxygen Re-Evaluation    Row Name 04/11/19 1137             Program Oxygen Prescription   Program Oxygen Prescription  None         Home Oxygen   Home Oxygen Device  None        Sleep Oxygen Prescription  None       Home Exercise Oxygen Prescription  None       Home at Rest Exercise Oxygen Prescription  None         Goals/Expected Outcomes   Short Term Goals  To learn and understand importance of maintaining oxygen saturations>88%;To learn and demonstrate proper pursed lip breathing techniques or other breathing techniques.;To learn and understand importance of monitoring SPO2 with pulse oximeter and demonstrate accurate use of the pulse oximeter.       Long  Term Goals  Maintenance of O2 saturations>88%;Verbalizes importance of monitoring SPO2 with pulse oximeter and return demonstration;Exhibits proper breathing techniques, such as pursed lip breathing or other method taught during program session       Comments  Reviewed PLB technique with pt.  Talked about how it work and it's important to maintaining Louis exercise saturations.       Goals/Expected Outcomes  Short: Become more profiecient at using PLB.   Long: Become independent at using PLB.          Oxygen Discharge (Final Oxygen Re-Evaluation): Oxygen Re-Evaluation - 04/11/19 1137      Program Oxygen Prescription   Program Oxygen Prescription  None      Home Oxygen   Home Oxygen Device  None    Sleep Oxygen Prescription  None    Home Exercise Oxygen Prescription  None    Home at Rest Exercise Oxygen Prescription  None      Goals/Expected Outcomes   Short Term Goals  To learn and understand importance of maintaining oxygen saturations>88%;To learn and demonstrate proper pursed lip breathing techniques or other breathing techniques.;To learn and understand importance of monitoring SPO2 with pulse oximeter and demonstrate accurate use of the pulse oximeter.    Long  Term Goals  Maintenance of O2 saturations>88%;Verbalizes importance of monitoring SPO2 with pulse oximeter and return demonstration;Exhibits proper breathing techniques, such as pursed lip breathing or other method taught during program  session    Comments  Reviewed PLB technique with pt.  Talked about how it work and it's important to maintaining Louis exercise saturations.    Goals/Expected Outcomes  Short: Become more profiecient at using PLB.   Long: Become independent at using PLB.       Initial Exercise Prescription: Initial Exercise Prescription - 04/08/19 1300      Date of Initial Exercise RX and Referring Provider   Date  04/08/19    Referring Provider  Ancil Linsey MD      Treadmill   MPH  1.9    Grade  0.5    Minutes  15    METs  2.59      Recumbant Bike   Level  1    RPM  50    Watts  10    Minutes  15    METs  2.5      NuStep   Level  1    SPM  80    Minutes  15    METs  2.5      Recumbant Elliptical   Level  1    RPM  50    Minutes  15    METs  2.5      Prescription Details   Frequency (times per week)  3    Duration  Progress to 30 minutes of continuous aerobic without signs/symptoms of physical distress      Intensity   THRR 40-80% of Max Heartrate  95-127    Ratings of Perceived Exertion  11-13    Perceived Dyspnea  0-4      Progression   Progression  Continue to progress workloads to maintain intensity without signs/symptoms of physical distress.      Resistance Training   Training Prescription  Yes    Weight  3 lb    Reps  10-15       Perform Capillary Blood Glucose checks as needed.  Exercise Prescription Changes: Exercise Prescription Changes    Row Name 04/08/19 1300 04/22/19 1000 04/25/19 1200 05/07/19 1200       Response to Exercise   Blood Pressure (Admit)  122/64  126/74  --  134/72    Blood Pressure (Exercise)  142/64  146/80  --  154/70    Blood Pressure (Exit)  134/70  106/64  --  120/60    Heart Rate (Admit)  63 bpm  69 bpm  --  66 bpm    Heart Rate (Exercise)  103 bpm  107 bpm  --  110 bpm    Heart Rate (Exit)  64 bpm  68 bpm  --  71 bpm    Oxygen Saturation (Admit)  94 %  95 %  --  94 %    Oxygen Saturation (Exercise)  88 %  89 %  --  84 %     Oxygen Saturation (Exit)  94 %  92 %  --  95 %    Rating of Perceived Exertion (Exercise)  14  12  --  15    Perceived Dyspnea (Exercise)  3  3  --  2    Symptoms  SOB  --  --  SOB, low sats on TM    Comments  walk test results  --  --  --    Duration  --  Continue with 30 min of aerobic exercise without signs/symptoms of physical distress.  --  Continue with 30 min of aerobic exercise without signs/symptoms of physical distress.    Intensity  --  THRR unchanged  --  THRR unchanged      Progression   Progression  --  Continue to progress workloads to maintain intensity without signs/symptoms of physical distress.  --  Continue to progress workloads to maintain intensity without signs/symptoms of physical distress.    Average METs  --  2.7  --  2.76      Resistance Training   Training Prescription  --  Yes  --  Yes    Weight  --  3 lb  --  3 lb    Reps  --  10-15  --  10-15      Interval Training   Interval Training  --  --  --  No      Treadmill  MPH  --  --  --  1.9    Grade  --  --  --  0.5    Minutes  --  --  --  15    METs  --  --  --  2.59      NuStep   Level  --  3  --  5    SPM  --  80  --  --    Minutes  --  15  --  15    METs  --  4.1  --  3.7      Recumbant Elliptical   Level  --  1  --  1.5    RPM  --  50  --  --    Minutes  --  15  --  15    METs  --  1.8  --  2      Home Exercise Plan   Plans to continue exercise at  --  --  Home (comment)  Home (comment)    Frequency  --  --  Add 1 additional day to program exercise sessions.  Add 1 additional day to program exercise sessions.    Initial Home Exercises Provided  --  --  04/25/19  04/25/19       Exercise Comments: Exercise Comments    Row Name 05/07/19 1025           Exercise Comments  HR up 140 at start of warm up Arrival HR was 110. Stopped Louis Gomez and resolved to 128. Looking at chart he has not had HR this high in past. Placed on tele monitor and A FIb seen. Called MD office to report and get  direction for care. Lawayne actually had appointment this AM with MD. Was advised to send him to office now. Escorted to office.          Exercise Goals and Review: Exercise Goals    Row Name 04/08/19 1312             Exercise Goals   Increase Physical Activity  Yes       Intervention  Provide advice, education, support and counseling about physical activity/exercise needs.;Develop an individualized exercise prescription for aerobic and resistive training based on initial evaluation findings, risk stratification, comorbidities and participant's personal goals.       Expected Outcomes  Short Term: Attend rehab on a regular basis to increase amount of physical activity.;Long Term: Exercising regularly at least 3-5 days a week.;Long Term: Add in home exercise to make exercise part of routine and to increase amount of physical activity.       Increase Strength and Stamina  Yes       Intervention  Provide advice, education, support and counseling about physical activity/exercise needs.;Develop an individualized exercise prescription for aerobic and resistive training based on initial evaluation findings, risk stratification, comorbidities and participant's personal goals.       Expected Outcomes  Short Term: Increase workloads from initial exercise prescription for resistance, speed, and METs.;Short Term: Perform resistance training exercises routinely during rehab and add in resistance training at home;Long Term: Improve cardiorespiratory fitness, muscular endurance and strength as measured by increased METs and functional capacity (6MWT)       Able to understand and use rate of perceived exertion (RPE) scale  Yes       Intervention  Provide education and explanation on how to use RPE scale       Expected Outcomes  Short  Term: Able to use RPE daily in rehab to express subjective intensity level;Long Term:  Able to use RPE to guide intensity level when exercising independently       Able to  understand and use Dyspnea scale  Yes       Intervention  Provide education and explanation on how to use Dyspnea scale       Expected Outcomes  Short Term: Able to use Dyspnea scale daily in rehab to express subjective sense of shortness of breath during exertion;Long Term: Able to use Dyspnea scale to guide intensity level when exercising independently       Knowledge and understanding of Target Heart Rate Range (THRR)  Yes       Intervention  Provide education and explanation of THRR including how the numbers were predicted and where they are located for reference       Expected Outcomes  Short Term: Able to state/look up THRR;Long Term: Able to use THRR to govern intensity when exercising independently;Short Term: Able to use daily as guideline for intensity in rehab       Able to check pulse independently  Yes       Intervention  Provide education and demonstration on how to check pulse in carotid and radial arteries.;Review the importance of being able to check your own pulse for safety during independent exercise       Expected Outcomes  Short Term: Able to explain why pulse checking is important during independent exercise;Long Term: Able to check pulse independently and accurately       Understanding of Exercise Prescription  Yes       Intervention  Provide education, explanation, and written materials on patient's individual exercise prescription       Expected Outcomes  Short Term: Able to explain program exercise prescription;Long Term: Able to explain home exercise prescription to exercise independently          Exercise Goals Re-Evaluation : Exercise Goals Re-Evaluation    Row Name 04/11/19 1134 04/22/19 1008 04/25/19 1205 05/07/19 1240 06/06/19 0734     Exercise Goal Re-Evaluation   Exercise Goals Review  Increase Physical Activity;Able to understand and use rate of perceived exertion (RPE) scale;Knowledge and understanding of Target Heart Rate Range (THRR);Understanding of  Exercise Prescription;Increase Strength and Stamina;Able to understand and use Dyspnea scale;Able to check pulse independently  Increase Physical Activity;Increase Strength and Stamina;Able to understand and use rate of perceived exertion (RPE) scale;Able to understand and use Dyspnea scale;Knowledge and understanding of Target Heart Rate Range (THRR);Able to check pulse independently;Understanding of Exercise Prescription  Increase Physical Activity;Increase Strength and Stamina;Able to understand and use rate of perceived exertion (RPE) scale;Able to understand and use Dyspnea scale;Knowledge and understanding of Target Heart Rate Range (THRR);Able to check pulse independently;Understanding of Exercise Prescription  Increase Physical Activity;Increase Strength and Stamina;Understanding of Exercise Prescription  --   Comments  Reviewed RPE scale, THR and program prescription with pt today.  Pt voiced understanding and was given a copy of goals to take home.  Louis Gomez is doing well in rehab.  He has moved up level on T4.  Oxygen stays above 88 % during exercise.  Reviewed home exercise with pt today.  Pt plans to  for exercise.  Reviewed THR, pulse, RPE, sign and symptoms, NTG use, and when to call 911 or MD.  Also discussed weather considerations and indoor options.  Pt voiced understanding.  Louis Gomez has been doing well in rehab.  He is now up to  level 5 on the NuStep adn 1.5 on the recumbent elliptical.  Louis oxygen levels have be dropping on the treadmill and we will continue to keep an eye on them. Today, Louis exericse was stopped due to a high HR and afib. He was sent to MD office for review.  We will continue to monitor Louis progress.  Out since last review.   Expected Outcomes  Short: Use RPE daily to regulate intensity. Long: Follow program prescription in THR.  Short - continue to attend consistently Long -  increase overall stamina  Short - exercise at home on days not at [place] - improve overall MET level   Short: Continue to monitor oxygen saturations on treadmill. Long: Continue to improve stamina.  --   Row Name 06/17/19 1056             Exercise Goal Re-Evaluation   Comments  Out since last review.          Discharge Exercise Prescription (Final Exercise Prescription Changes): Exercise Prescription Changes - 05/07/19 1200      Response to Exercise   Blood Pressure (Admit)  134/72    Blood Pressure (Exercise)  154/70    Blood Pressure (Exit)  120/60    Heart Rate (Admit)  66 bpm    Heart Rate (Exercise)  110 bpm    Heart Rate (Exit)  71 bpm    Oxygen Saturation (Admit)  94 %    Oxygen Saturation (Exercise)  84 %    Oxygen Saturation (Exit)  95 %    Rating of Perceived Exertion (Exercise)  15    Perceived Dyspnea (Exercise)  2    Symptoms  SOB, low sats on TM    Duration  Continue with 30 min of aerobic exercise without signs/symptoms of physical distress.    Intensity  THRR unchanged      Progression   Progression  Continue to progress workloads to maintain intensity without signs/symptoms of physical distress.    Average METs  2.76      Resistance Training   Training Prescription  Yes    Weight  3 lb    Reps  10-15      Interval Training   Interval Training  No      Treadmill   MPH  1.9    Grade  0.5    Minutes  15    METs  2.59      NuStep   Level  5    Minutes  15    METs  3.7      Recumbant Elliptical   Level  1.5    Minutes  15    METs  2      Home Exercise Plan   Plans to continue exercise at  Home (comment)    Frequency  Add 1 additional day to program exercise sessions.    Initial Home Exercises Provided  04/25/19       Nutrition:  Target Goals: Understanding of nutrition guidelines, daily intake of sodium <1578m, cholesterol <202m calories 30% from fat and 7% or less from saturated fats, daily to have 5 or more servings of fruits and vegetables.  Biometrics: Pre Biometrics - 04/08/19 1313      Pre Biometrics   Height  _0  (1.676  m)    Weight  159 lb 8 oz (72.3 kg)    BMI (Calculated)  25.76    Single Leg Stand  1.63 seconds  Nutrition Therapy Plan and Nutrition Goals: Nutrition Therapy & Goals - 04/22/19 1202      Nutrition Therapy   Diet  Low Na, HH diet    Protein (specify units)  55-60g    Fiber  30 grams    Whole Grain Foods  3 servings    Saturated Fats  12 max. grams    Fruits and Vegetables  5 servings/day    Sodium  1.5 grams      Personal Nutrition Goals   Nutrition Goal  ST: continue current diet LT: Improve breathing and reduce SOB.    Comments  B: coffee (sugar + half n half) + bowl cereal (raisin bran w/ whole milk) or sausage/ham biscuit. D: sandwich (meat, cheese, on whole wheat) pepsi and sometimes chips and snack cakes or salad or leftovers from family or TV dinners. water during the day, doesn't drink as much since it got cold. Pt reports not wanting to make any changes. Discussed HH eating and making sure getting enough kcal and protein. Pt reports being weight stable, but energy is low. Will continue to monitor, pt does not want to make any chanegs at this time.      Intervention Plan   Intervention  Prescribe, educate and counsel regarding individualized specific dietary modifications aiming towards targeted core components such as weight, hypertension, lipid management, diabetes, heart failure and other comorbidities.;Nutrition handout(s) given to patient.    Expected Outcomes  Short Term Goal: Understand basic principles of dietary content, such as calories, fat, sodium, cholesterol and nutrients.;Short Term Goal: A plan has been developed with personal nutrition goals set during dietitian appointment.;Long Term Goal: Adherence to prescribed nutrition plan.       Nutrition Assessments: Nutrition Assessments - 04/08/19 1138      MEDFICTS Scores   Pre Score  95       Nutrition Goals Re-Evaluation:   Nutrition Goals Discharge (Final Nutrition Goals  Re-Evaluation):   Psychosocial: Target Goals: Acknowledge presence or absence of significant depression and/or stress, maximize coping skills, provide positive support system. Participant is able to verbalize types and ability to use techniques and skills needed for reducing stress and depression.   Initial Review & Psychosocial Screening: Initial Psych Review & Screening - 04/04/19 1308      Initial Review   Current issues with  Current Stress Concerns    Source of Stress Concerns  Financial      Family Dynamics   Good Support System?  Yes      Barriers   Psychosocial barriers to participate in program  There are no identifiable barriers or psychosocial needs.      Screening Interventions   Interventions  Encouraged to exercise;To provide support and resources with identified psychosocial needs;Provide feedback about the scores to participant    Expected Outcomes  Short Term goal: Utilizing psychosocial counselor, staff and physician to assist with identification of specific Stressors or current issues interfering with healing process. Setting desired goal for each stressor or current issue identified.;Long Term Goal: Stressors or current issues are controlled or eliminated.;Short Term goal: Identification and review with participant of any Quality of Life or Depression concerns found by scoring the questionnaire.;Long Term goal: The participant improves quality of Life and PHQ9 Scores as seen by post scores and/or verbalization of changes       Quality of Life Scores:  Scores of 19 and below usually indicate a poorer quality of life in these areas.  A difference of  2-3 points is  a clinically meaningful difference.  A difference of 2-3 points in the total score of the Quality of Life Index has been associated with significant improvement in overall quality of life, self-image, physical symptoms, and general health in studies assessing change in quality of life.  PHQ-9: Recent Review  Flowsheet Data    Depression screen [facility] 2/9 04/08/2019   Decreased Interest 0   Down, Depressed, Hopeless 0   PHQ - 2 Score 0   Altered sleeping 0   Tired, decreased energy 1   Change in appetite 1   Feeling bad or failure about yourself  0   Trouble concentrating 0   Moving slowly or fidgety/restless 0   Suicidal thoughts 0   PHQ-9 Score 2   Difficult doing work/chores Not difficult at all     Interpretation of Total Score  Total Score Depression Severity:  1-4 = Minimal depression, 5-9 = Mild depression, 10-14 = Moderate depression, 15-19 = Moderately severe depression, 20-27 = Severe depression   Psychosocial Evaluation and Intervention: Psychosocial Evaluation - 04/04/19 1355      Psychosocial Evaluation & Interventions   Interventions  Stress management education;Encouraged to exercise with the program and follow exercise prescription    Comments  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 He hopes this program helps some with Louis stamina. He does have someone that helps out with some of the farm duties, but paying him is difficult sometimes. Louis girlfriend is very supportive and Louis Gomez lives down the road from him if he needs anything    Expected Outcomes  Short: attend [facility] to increase stamina Long: develop self care habits.    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    Expected Outcomes  Short - continue to attend consistently Long - manage stress on Louis own       Education: Education Goals: Education classes will be provided on a weekly basis, covering required topics. Participant will state understanding/return demonstration of topics presented.  Learning Barriers/Preferences: Learning Barriers/Preferences - 04/04/19 1308      Learning Barriers/Preferences   Learning Barriers  None    Learning Preferences  None       Education Topics:  Initial Evaluation Education: - Verbal, written and demonstration of respiratory meds, oximetry and breathing techniques. Instruction on use of nebulizers and MDIs and importance of monitoring MDI activations.   Pulmonary Rehab from 04/23/2019 in  [facility]  Date  04/08/19  Educator  [facility]  Instruction Review Code  1- Verbalizes Understanding      General Nutrition Guidelines/Fats and Fiber: -Group instruction provided by verbal, written material, models and posters to present the general guidelines for heart healthy nutrition. Gives an explanation and review of dietary fats and fiber.   Controlling Sodium/Reading Food Labels: -Group verbal and written material supporting the discussion of sodium use in heart healthy nutrition. Review and explanation with models, verbal and written materials for utilization of the food label.   Exercise Physiology & General Exercise Guidelines: - Group verbal and written instruction with models to review the exercise physiology of the cardiovascular system and associated critical values. Provides general exercise guidelines with specific guidelines to those with heart or lung disease.    Aerobic  Exercise & Resistance Training: - Gives group verbal and written instruction on the various components of exercise. Focuses on aerobic and resistive training programs and the benefits of this training and how to safely progress through these programs.   Flexibility, Balance, Mind/Body Relaxation: Provides group verbal/written instruction on the benefits of flexibility and balance training, including mind/body exercise modes such as yoga, pilates and tai chi.  Demonstration and skill practice provided.   Pulmonary Rehab from 04/23/2019 in [facility]  Date  04/23/19  Educator  [facility]  Instruction Review Code  1- Verbalizes Understanding      Stress and Anxiety: - Provides group verbal and written instruction about the health risks of elevated stress and causes of high stress.  Discuss the correlation between heart/lung disease and anxiety and treatment options. Review healthy ways to manage with stress and anxiety.   Depression: - Provides group verbal and written instruction on the correlation between heart/lung disease and depressed mood, treatment options, and the stigmas associated with seeking treatment.   Exercise & Equipment Safety: - Individual verbal instruction and demonstration of equipment use and safety with use of the equipment.   Pulmonary Rehab from 04/23/2019 in [facility]  Date  04/08/19  Educator  [facility]  Instruction Review Code  1- Verbalizes Understanding      Infection Prevention: - Provides verbal and written material to individual with discussion of infection control including proper hand washing and proper equipment cleaning during exercise session.   Pulmonary Rehab from 04/23/2019 in [facility]  Date  04/08/19  Educator  [facility]  Instruction Review Code  1- Verbalizes Understanding      Falls Prevention: - Provides verbal and written material to individual with discussion of falls prevention and  safety.   Pulmonary Rehab from 04/23/2019 in [facility]  Date  04/08/19  Educator  [facility]  Instruction Review Code  1- Verbalizes Understanding      Diabetes: - Individual verbal and written instruction to review signs/symptoms of diabetes, desired ranges of glucose level fasting, after meals and with exercise. Advice that pre and post exercise glucose checks will be done for 3 sessions at entry of program.   Chronic Lung Diseases: - Group verbal and written instruction to review updates, respiratory medications, advancements in procedures and treatments. Discuss use of supplemental oxygen including available portable oxygen systems, continuous and intermittent flow rates, concentrators, personal use and safety guidelines. Review proper use of inhaler and spacers. Provide informative websites for self-education.    Pulmonary Rehab from 04/23/2019 in [facility]  Date  04/23/19  Educator  [facility]  Instruction Review Code  1- Verbalizes Understanding      Energy Conservation: - Provide group verbal and written instruction for methods to conserve energy, plan and organize activities. Instruct on pacing techniques, use of adaptive equipment and posture/positioning to relieve shortness of breath.   Triggers and Exacerbations: - Group verbal and written instruction to review types of environmental triggers and ways to prevent exacerbations. Discuss weather changes, air quality and the benefits of nasal washing. Review warning signs and symptoms to help prevent infections. Discuss techniques for effective airway clearance, coughing, and vibrations.   AED/CPR: - Group verbal and written instruction with the use of models to demonstrate the basic use of the AED with the basic ABC's of resuscitation.   Anatomy and Physiology of the Lungs: - Group verbal and written instruction with the use of models to provide basic lung anatomy and physiology related to  function, structure and complications of lung disease.   Anatomy & Physiology of the Heart: - Group verbal and written instruction and models provide basic cardiac anatomy and physiology, with the coronary electrical and arterial systems. Review of Valvular disease and Heart Failure   Cardiac Medications: - Group verbal and written instruction to review commonly prescribed medications for heart disease. Reviews the medication, class of the drug, and side effects.   Know Your Numbers and Risk Factors: -Group verbal and written instruction about important numbers in your health.  Discussion of what are risk factors and how they play a role in the disease process.  Review of Cholesterol, Blood Pressure, Diabetes, and BMI and the role they play in your overall health.   Sleep Hygiene: -Provides group verbal and written instruction about how sleep can affect your health.  Define sleep hygiene, discuss sleep cycles and impact of sleep habits. Review good sleep hygiene tips.    Other: -Provides group and verbal instruction on various topics (see comments)    Knowledge Questionnaire Score: Knowledge Questionnaire Score - 04/08/19 1314      Knowledge Questionnaire Score   Pre Score  13/16 Education Focus: Exercise, PLB, oxygen safety        Core Components/Risk Factors/Patient Goals at Admission: Personal Goals and Risk Factors at Admission - 04/08/19 1315      Core Components/Risk Factors/Patient Goals on Admission    Weight Management  Yes;Weight Loss    Intervention  Weight Management: Develop a combined nutrition and exercise program designed to reach desired caloric intake, while maintaining appropriate intake of nutrient and fiber, sodium and fats, and appropriate energy expenditure required for the weight goal.;Weight Management: Provide education and appropriate resources to help participant work on and attain dietary goals.    Admit Weight  159 lb 8 oz (72.3 kg)    Goal Weight:  Short Term  154 lb (69.9 kg)    Goal Weight: Long Term  150 lb (68 kg)    Expected Outcomes  Short Term: Continue to assess and modify interventions until short term weight is achieved;Long Term: Adherence to nutrition and physical activity/exercise program aimed toward attainment of established weight goal;Weight Loss: Understanding of general recommendations for a balanced deficit meal plan, which promotes 1-2 lb weight loss per week and includes a negative energy balance of [phone] kcal/d;Understanding recommendations for meals to include 15-35% energy as protein, 25-35% energy from fat, 35-60% energy from carbohydrates, less than 289m of dietary cholesterol, 20-35 gm of total fiber daily;Understanding of distribution of calorie intake throughout the day with the consumption of 4-5  meals/snacks    Improve shortness of breath with ADL's  Yes    Intervention  Provide education, individualized exercise plan and daily activity instruction to help decrease symptoms of SOB with activities of daily living.    Expected Outcomes  Short Term: Improve cardiorespiratory fitness to achieve a reduction of symptoms when performing ADLs;Long Term: Be able to perform more ADLs without symptoms or delay the onset of symptoms    Lipids  Yes    Intervention  Provide education and support for participant on nutrition & aerobic/resistive exercise along with prescribed medications to achieve LDL <34m, HDL >484m    Expected Outcomes  Short Term: Participant states understanding of desired cholesterol values and is compliant with medications prescribed. Participant is following exercise prescription and nutrition guidelines.;Long Term: Cholesterol controlled with medications as prescribed, with individualized exercise RX and with personalized nutrition plan. Value goals: LDL < 7058mHDL > 40 mg.       Core Components/Risk Factors/Patient Goals Review:  Goals and Risk Factor Review    Row Name 05/05/19 1110              Core Components/Risk Factors/Patient Goals Review   Personal Goals Review  Weight Management/Obesity;Increase knowledge of respiratory medications and ability to use respiratory devices properly.;Improve shortness of breath with ADL's       Review  Louis Gomez taking all meds as directed.  He feels he can do more ADLs with less shortness of breath.  He hasnt been exercising at home.  He is able to do yard work around [place] home when the weather is nice.       Expected Outcomes  Short - attend LW consistently Long - improve stamina overall          Core Components/Risk Factors/Patient Goals at Discharge (Final Review):  Goals and Risk Factor Review - 05/05/19 1110      Core Components/Risk Factors/Patient Goals Review   Personal Goals Review  Weight Management/Obesity;Increase knowledge of respiratory medications and ability to use respiratory devices properly.;Improve shortness of breath with ADL's    Review  Louis Gomez taking all meds as directed.  He feels he can do more ADLs with less shortness of breath.  He hasnt been exercising at home.  He is able to do yard work around [place] home when the weather is nice.    Expected Outcomes  Short - attend LW consistently Long - improve stamina overall       ITP Comments: ITP Comments    Row Name 04/04/19 1321 04/08/19 1258 04/11/19 1133 04/22/19 1227 04/23/19 1030   ITP Comments  Initial telephone orientation completed. Diagnosis completed 9/23. EP orientation scheduled for 11/17 at 9:30  Completed 6MWT and gym orientation.  Initial ITP created and sent for review to Dr. MarEmily Filbertedical Director.  First full day of exercise!  Patient was oriented to gym and equipment including functions, settings, policies, and procedures.  Patient's individual exercise prescription and treatment plan were reviewed.  All starting workloads were established based on the results of the 6 minute walk test done at initial orientation visit.  The plan for exercise  progression was also introduced and progression will be customized based on patient's performance and goals.  Completed intial RD eval  30 day review competed . ITP sent to Dr MarEmily Filbertr review, changes as needed and ITP approval signature.   [place] 05/07/19 1024 05/21/19 0908 05/22/19 1332 06/06/19 0731 06/16/19 1649   ITP Comments  HR  up 140 at start of warm up Arrival HR was 110. Stopped Louis Gomez and resolved to 128. Looking at chart he has not had HR this high in past. Placed on tele monitor and A FIb seen. Called MD office to report and get direction for care. Louis Gomez actually had appointment this AM with MD. Was advised to send him to office now. Escorted to office.  30 day review competed . ITP sent to Dr Emily Filbert for review, changes as needed and ITP approval signature. Louis Gomez called this week to say Louis physician wants him to stay out of rehab until further notice.  See note from 05/18/2019 - Louis Gomez will stay out until he is cleared by Louis Dr to return  Louis Gomez has been out since 05/07/19 for some heart issues.  He had a consult for afib with Dr. Ubaldo Glassing on 05/27/19 for afib.  They did not note when he could return to rehab in note.  Louis Gomez has not returned to [facility].   [place] Name 06/17/19 1056 06/18/19 1441         ITP Comments  Pt has been out for evaluation for afib.  Called to check in, left message.  OV notes show medication change and no follow up for 3 months.  Pt should be able to return.  Out since 05/07/19.  30 day review completed. ITP sent to Dr. Emily Filbert, Medical Director of Cardiac and Pulmonary Rehab. Continue with ITP unless changes are made by physician.  Department operating under reduced schedule until further notice by request from hospital leadership.  Pt out since 05/07/19.         Comments: 30 day review

## 2019-06-25 ENCOUNTER — Encounter: Payer: Self-pay | Admitting: *Deleted

## 2019-06-25 DIAGNOSIS — J8410 Pulmonary fibrosis, unspecified: Secondary | ICD-10-CM

## 2019-06-25 NOTE — Progress Notes (Signed)
Louis Gomez called program to be discharged.  He stated that his doctor told him to stay away from Cardiac Rehab.  Discharge completed.

## 2019-06-25 NOTE — Progress Notes (Signed)
Discharge Progress Report  Patient Details  Name: Louis Gomez MRN: [id number] Date of Birth: 13-Jul-1940 Referring Provider:     Pulmonary Rehab from 04/08/2019 in [facility]  Referring Provider  Ancil Linsey MD       Number of Visits: 12/36  Reason for Discharge:  Early Exit:  Patient request. He stated that his doctor told him to stay away from Pulmonary Rehab  Smoking History:  Social History   Tobacco Use  Smoking Status Never Smoker  Smokeless Tobacco Current User  . Types: Chew    Diagnosis:  Pulmonary fibrosis (HCC)  ADL UCSD: Pulmonary Assessment Scores    Row Name 04/08/19 1318         ADL [facility]SD   ADL Phase  Entry     SOB Score total  17     Rest  0     Walk  1     Stairs  2     Bath  0     Dress  1     Shop  0       CAT Score   CAT Score  6       mMRC Score   mMRC Score  2        Initial Exercise Prescription: Initial Exercise Prescription - 04/08/19 1300      Date of Initial Exercise RX and Referring Provider   Date  04/08/19    Referring Provider  Ancil Linsey MD      Treadmill   MPH  1.9    Grade  0.5    Minutes  15    METs  2.59      Recumbant Bike   Level  1    RPM  50    Watts  10    Minutes  15    METs  2.5      NuStep   Level  1    SPM  80    Minutes  15    METs  2.5      Recumbant Elliptical   Level  1    RPM  50    Minutes  15    METs  2.5      Prescription Details   Frequency (times per week)  3    Duration  Progress to 30 minutes of continuous aerobic without signs/symptoms of physical distress      Intensity   THRR 40-80% of Max Heartrate  95-127    Ratings of Perceived Exertion  11-13    Perceived Dyspnea  0-4      Progression   Progression  Continue to progress workloads to maintain intensity without signs/symptoms of physical distress.      Resistance Training   Training Prescription  Yes    Weight  3 lb    Reps  10-15       Discharge Exercise Prescription  (Final Exercise Prescription Changes): Exercise Prescription Changes - 05/07/19 1200      Response to Exercise   Blood Pressure (Admit)  134/72    Blood Pressure (Exercise)  154/70    Blood Pressure (Exit)  120/60    Heart Rate (Admit)  66 bpm    Heart Rate (Exercise)  110 bpm    Heart Rate (Exit)  71 bpm    Oxygen Saturation (Admit)  94 %    Oxygen Saturation (Exercise)  84 %    Oxygen Saturation (Exit)  95 %    Rating  of Perceived Exertion (Exercise)  15    Perceived Dyspnea (Exercise)  2    Symptoms  SOB, low sats on TM    Duration  Continue with 30 min of aerobic exercise without signs/symptoms of physical distress.    Intensity  THRR unchanged      Progression   Progression  Continue to progress workloads to maintain intensity without signs/symptoms of physical distress.    Average METs  2.76      Resistance Training   Training Prescription  Yes    Weight  3 lb    Reps  10-15      Interval Training   Interval Training  No      Treadmill   MPH  1.9    Grade  0.5    Minutes  15    METs  2.59      NuStep   Level  5    Minutes  15    METs  3.7      Recumbant Elliptical   Level  1.5    Minutes  15    METs  2      Home Exercise Plan   Plans to continue exercise at  Home (comment)    Frequency  Add 1 additional day to program exercise sessions.    Initial Home Exercises Provided  04/25/19       Functional Capacity: 6 Minute Walk    Row Name 04/08/19 1301         6 Minute Walk   Phase  Initial     Distance  1135 feet     Walk Time  6 minutes     # of Rest Breaks  0     MPH  2.15     METS  2.45     RPE  14     Perceived Dyspnea   3     VO2 Peak  8.58     Symptoms  Yes (comment)     Comments  SOB     Resting HR  63 bpm     Resting BP  122/64     Resting Oxygen Saturation   94 %     Exercise Oxygen Saturation  during 6 min walk  88 %     Max Ex. HR  103 bpm     Max Ex. BP  146/74     2 Minute Post BP  134/70       Interval HR   1 Minute HR   87     2 Minute HR  99     3 Minute HR  77     4 Minute HR  62     5 Minute HR  63     6 Minute HR  103     2 Minute Post HR  67     Interval Heart Rate?  Yes       Interval Oxygen   Interval Oxygen?  Yes     Baseline Oxygen Saturation %  94 %     1 Minute Oxygen Saturation %  93 %     1 Minute Liters of Oxygen  0 L Room Air     2 Minute Oxygen Saturation %  92 %     2 Minute Liters of Oxygen  0 L     3 Minute Oxygen Saturation %  91 %     3 Minute Liters of Oxygen  0 L  4 Minute Oxygen Saturation %  97 %     4 Minute Liters of Oxygen  0 L     5 Minute Oxygen Saturation %  89 %     5 Minute Liters of Oxygen  0 L     6 Minute Oxygen Saturation %  88 %     6 Minute Liters of Oxygen  0 L     2 Minute Post Oxygen Saturation %  94 %     2 Minute Post Liters of Oxygen  0 L        Psychological, QOL, Others - Outcomes: PHQ 2/9: Depression screen PHQ 2/9 04/08/2019  Decreased Interest 0  Down, Depressed, Hopeless 0  PHQ - 2 Score 0  Altered sleeping 0  Tired, decreased energy 1  Change in appetite 1  Feeling bad or failure about yourself  0  Trouble concentrating 0  Moving slowly or fidgety/restless 0  Suicidal thoughts 0  PHQ-9 Score 2  Difficult doing work/chores Not difficult at all    Quality of Life:   Personal Goals: Goals established at orientation with interventions provided to work toward goal. Personal Goals and Risk Factors at Admission - 04/08/19 1315      Core Components/Risk Factors/Patient Goals on Admission    Weight Management  Yes;Weight Loss    Intervention  Weight Management: Develop a combined nutrition and exercise program designed to reach desired caloric intake, while maintaining appropriate intake of nutrient and fiber, sodium and fats, and appropriate energy expenditure required for the weight goal.;Weight Management: Provide education and appropriate resources to help participant work on and attain dietary goals.    Admit Weight  159 lb 8  oz (72.3 kg)    Goal Weight: Short Term  154 lb (69.9 kg)    Goal Weight: Long Term  150 lb (68 kg)    Expected Outcomes  Short Term: Continue to assess and modify interventions until short term weight is achieved;Long Term: Adherence to nutrition and physical activity/exercise program aimed toward attainment of established weight goal;Weight Loss: Understanding of general recommendations for a balanced deficit meal plan, which promotes 1-2 lb weight loss per week and includes a negative energy balance of [phone] kcal/d;Understanding recommendations for meals to include 15-35% energy as protein, 25-35% energy from fat, 35-60% energy from carbohydrates, less than 275m of dietary cholesterol, 20-35 gm of total fiber daily;Understanding of distribution of calorie intake throughout the day with the consumption of 4-5 meals/snacks    Improve shortness of breath with ADL's  Yes    Intervention  Provide education, individualized exercise plan and daily activity instruction to help decrease symptoms of SOB with activities of daily living.    Expected Outcomes  Short Term: Improve cardiorespiratory fitness to achieve a reduction of symptoms when performing ADLs;Long Term: Be able to perform more ADLs without symptoms or delay the onset of symptoms    Lipids  Yes    Intervention  Provide education and support for participant on nutrition & aerobic/resistive exercise along with prescribed medications to achieve LDL <752m HDL >4073m   Expected Outcomes  Short Term: Participant states understanding of desired cholesterol values and is compliant with medications prescribed. Participant is following exercise prescription and nutrition guidelines.;Long Term: Cholesterol controlled with medications as prescribed, with individualized exercise RX and with personalized nutrition plan. Value goals: LDL < 57m62mDL > 40 mg.        Personal Goals Discharge: Goals and Risk Factor Review  [place] Name 05/05/19 1110              Core Components/Risk Factors/Patient Goals Review   Personal Goals Review  Weight Management/Obesity;Increase knowledge of respiratory medications and ability to use respiratory devices properly.;Improve shortness of breath with ADL's       Review  Ladislao is taking all meds as directed.  He feels he can do more ADLs with less shortness of breath.  He hasnt been exercising at home.  He is able to do yard work around his home when the weather is nice.       Expected Outcomes  Short - attend LW consistently Long - improve stamina overall          Exercise Goals and Review: Exercise Goals    Row Name 04/08/19 1312             Exercise Goals   Increase Physical Activity  Yes       Intervention  Provide advice, education, support and counseling about physical activity/exercise needs.;Develop an individualized exercise prescription for aerobic and resistive training based on initial evaluation findings, risk stratification, comorbidities and participant's personal goals.       Expected Outcomes  Short Term: Attend rehab on a regular basis to increase amount of physical activity.;Long Term: Exercising regularly at least 3-5 days a week.;Long Term: Add in home exercise to make exercise part of routine and to increase amount of physical activity.       Increase Strength and Stamina  Yes       Intervention  Provide advice, education, support and counseling about physical activity/exercise needs.;Develop an individualized exercise prescription for aerobic and resistive training based on initial evaluation findings, risk stratification, comorbidities and participant's personal goals.       Expected Outcomes  Short Term: Increase workloads from initial exercise prescription for resistance, speed, and METs.;Short Term: Perform resistance training exercises routinely during rehab and add in resistance training at home;Long Term: Improve cardiorespiratory fitness, muscular endurance and strength as  measured by increased METs and functional capacity (6MWT)       Able to understand and use rate of perceived exertion (RPE) scale  Yes       Intervention  Provide education and explanation on how to use RPE scale       Expected Outcomes  Short Term: Able to use RPE daily in rehab to express subjective intensity level;Long Term:  Able to use RPE to guide intensity level when exercising independently       Able to understand and use Dyspnea scale  Yes       Intervention  Provide education and explanation on how to use Dyspnea scale       Expected Outcomes  Short Term: Able to use Dyspnea scale daily in rehab to express subjective sense of shortness of breath during exertion;Long Term: Able to use Dyspnea scale to guide intensity level when exercising independently       Knowledge and understanding of Target Heart Rate Range (THRR)  Yes       Intervention  Provide education and explanation of THRR including how the numbers were predicted and where they are located for reference       Expected Outcomes  Short Term: Able to state/look up THRR;Long Term: Able to use THRR to govern intensity when exercising independently;Short Term: Able to use daily as guideline for intensity in rehab       Able to check pulse independently  Yes  Intervention  Provide education and demonstration on how to check pulse in carotid and radial arteries.;Review the importance of being able to check your own pulse for safety during independent exercise       Expected Outcomes  Short Term: Able to explain why pulse checking is important during independent exercise;Long Term: Able to check pulse independently and accurately       Understanding of Exercise Prescription  Yes       Intervention  Provide education, explanation, and written materials on patient's individual exercise prescription       Expected Outcomes  Short Term: Able to explain program exercise prescription;Long Term: Able to explain home exercise prescription to  exercise independently          Exercise Goals Re-Evaluation: Exercise Goals Re-Evaluation    Row Name 04/11/19 1134 04/22/19 1008 04/25/19 1205 05/07/19 1240 06/06/19 0734     Exercise Goal Re-Evaluation   Exercise Goals Review  Increase Physical Activity;Able to understand and use rate of perceived exertion (RPE) scale;Knowledge and understanding of Target Heart Rate Range (THRR);Understanding of Exercise Prescription;Increase Strength and Stamina;Able to understand and use Dyspnea scale;Able to check pulse independently  Increase Physical Activity;Increase Strength and Stamina;Able to understand and use rate of perceived exertion (RPE) scale;Able to understand and use Dyspnea scale;Knowledge and understanding of Target Heart Rate Range (THRR);Able to check pulse independently;Understanding of Exercise Prescription  Increase Physical Activity;Increase Strength and Stamina;Able to understand and use rate of perceived exertion (RPE) scale;Able to understand and use Dyspnea scale;Knowledge and understanding of Target Heart Rate Range (THRR);Able to check pulse independently;Understanding of Exercise Prescription  Increase Physical Activity;Increase Strength and Stamina;Understanding of Exercise Prescription  --   Comments  Reviewed RPE scale, THR and program prescription with pt today.  Pt voiced understanding and was given a copy of goals to take home.  Petar is doing well in rehab.  He has moved up level on T4.  Oxygen stays above 88 % during exercise.  Reviewed home exercise with pt today.  Pt plans to  for exercise.  Reviewed THR, pulse, RPE, sign and symptoms, NTG use, and when to call 911 or MD.  Also discussed weather considerations and indoor options.  Pt voiced understanding.  Dasean has been doing well in rehab.  He is now up to level 5 on the NuStep adn 1.5 on the recumbent elliptical.  His oxygen levels have be dropping on the treadmill and we will continue to keep an eye on them. Today, his  exericse was stopped due to a high HR and afib. He was sent to MD office for review.  We will continue to monitor his progress.  Out since last review.   Expected Outcomes  Short: Use RPE daily to regulate intensity. Long: Follow program prescription in THR.  Short - continue to attend consistently Long -  increase overall stamina  Short - exercise at home on days not at [place] - improve overall MET level  Short: Continue to monitor oxygen saturations on treadmill. Long: Continue to improve stamina.  --   Row Name 06/17/19 1056             Exercise Goal Re-Evaluation   Comments  Out since last review.          Nutrition & Weight - Outcomes: Pre Biometrics - 04/08/19 1313      Pre Biometrics   Height  _0  (1.676 m)    Weight  159 lb 8 oz (72.3 kg)  BMI (Calculated)  25.76    Single Leg Stand  1.63 seconds        Nutrition: Nutrition Therapy & Goals - 04/22/19 1202      Nutrition Therapy   Diet  Low Na, HH diet    Protein (specify units)  55-60g    Fiber  30 grams    Whole Grain Foods  3 servings    Saturated Fats  12 max. grams    Fruits and Vegetables  5 servings/day    Sodium  1.5 grams      Personal Nutrition Goals   Nutrition Goal  ST: continue current diet LT: Improve breathing and reduce SOB.    Comments  B: coffee (sugar + half n half) + bowl cereal (raisin bran w/ whole milk) or sausage/ham biscuit. D: sandwich (meat, cheese, on whole wheat) pepsi and sometimes chips and snack cakes or salad or leftovers from family or TV dinners. water during the day, doesn't drink as much since it got cold. Pt reports not wanting to make any changes. Discussed HH eating and making sure getting enough kcal and protein. Pt reports being weight stable, but energy is low. Will continue to monitor, pt does not want to make any chanegs at this time.      Intervention Plan   Intervention  Prescribe, educate and counsel regarding individualized specific dietary modifications aiming  towards targeted core components such as weight, hypertension, lipid management, diabetes, heart failure and other comorbidities.;Nutrition handout(s) given to patient.    Expected Outcomes  Short Term Goal: Understand basic principles of dietary content, such as calories, fat, sodium, cholesterol and nutrients.;Short Term Goal: A plan has been developed with personal nutrition goals set during dietitian appointment.;Long Term Goal: Adherence to prescribed nutrition plan.       Nutrition Discharge: Nutrition Assessments - 04/08/19 1138      MEDFICTS Scores   Pre Score  95       Education Questionnaire Score: Knowledge Questionnaire Score - 04/08/19 1314      Knowledge Questionnaire Score   Pre Score  13/16 Education Focus: Exercise, PLB, oxygen safety       Goals reviewed with patient; copy given to patient.

## 2019-06-25 NOTE — Progress Notes (Signed)
Pulmonary Individual Treatment Plan  Patient Details  Name: Louis Gomez MRN: [id number] Date of Birth: Mar 22, 1941 Referring Provider:     Pulmonary Rehab from 04/08/2019 in [facility] Cardiac and Pulmonary Rehab  Referring Provider  Ancil Linsey MD      Initial Encounter Date:    Pulmonary Rehab from 04/08/2019 in [facility] Cardiac and Pulmonary Rehab  Date  04/08/19      Visit Diagnosis: Pulmonary fibrosis ([place])  Patient's Home Medications on Admission:  Current Outpatient Medications:  .  aspirin EC 81 MG tablet, Take by mouth., Disp: , Rfl:  .  atorvastatin (LIPITOR) 40 MG tablet, , Disp: , Rfl:  .  azelastine (ASTELIN) 0.1 % nasal spray, Place into the nose., Disp: , Rfl:  .  Multiple Vitamin (MULTIVITAMIN) capsule, Take by mouth., Disp: , Rfl:   Past Medical History: No past medical history on file.  Tobacco Use: Social History   Tobacco Use  Smoking Status Never Smoker  Smokeless Tobacco Current User  . Types: Chew    Labs: Recent Review Flowsheet Data    There is no flowsheet data to display.       Pulmonary Assessment Scores: Pulmonary Assessment Scores    Row Name 04/08/19 1318         ADL UCSD   ADL Phase  Entry     SOB Score total  17     Rest  0     Walk  1     Stairs  2     Bath  0     Dress  1     Shop  0       CAT Score   CAT Score  6       mMRC Score   mMRC Score  2        UCSD: Self-administered rating of dyspnea associated with activities of daily living (ADLs) 6-point scale (0 = "not at all" to 5 = "maximal or unable to do because of breathlessness")  Scoring Scores range from 0 to 120.  Minimally important difference is 5 units  CAT: CAT can identify the health impairment of COPD patients and is better correlated with disease progression.  CAT has a scoring range of zero to 40. The CAT score is classified into four groups of low (less than 10), medium (10 - 20), high (21-30) and very high (31-40) based on the impact level of  disease on health status. A CAT score over 10 suggests significant symptoms.  A worsening CAT score could be explained by an exacerbation, poor medication adherence, poor inhaler technique, or progression of COPD or comorbid conditions.  CAT MCID is 2 points  mMRC: mMRC (Modified Medical Research Council) Dyspnea Scale is used to assess the degree of baseline functional disability in patients of respiratory disease due to dyspnea. No minimal important difference is established. A decrease in score of 1 point or greater is considered a positive change.   Pulmonary Function Assessment:   Exercise Target Goals: Exercise Program Goal: Individual exercise prescription set using results from initial 6 min walk test and THRR while considering  patient's activity barriers and safety.   Exercise Prescription Goal: Initial exercise prescription builds to 30-45 minutes a day of aerobic activity, 2-3 days per week.  Home exercise guidelines will be given to patient during program as part of exercise prescription that the participant will acknowledge.  Activity Barriers & Risk Stratification: Activity Barriers & Cardiac Risk Stratification - 04/08/19 1303  Activity Barriers & Cardiac Risk Stratification   Activity Barriers  Deconditioning;Muscular Weakness;Balance Concerns;Shortness of Breath       6 Minute Walk: 6 Minute Walk    Row Name 04/08/19 1301         6 Minute Walk   Phase  Initial     Distance  1135 feet     Walk Time  6 minutes     # of Rest Breaks  0     MPH  2.15     METS  2.45     RPE  14     Perceived Dyspnea   3     VO2 Peak  8.58     Symptoms  Yes (comment)     Comments  SOB     Resting HR  63 bpm     Resting BP  122/64     Resting Oxygen Saturation   94 %     Exercise Oxygen Saturation  during 6 min walk  88 %     Max Ex. HR  103 bpm     Max Ex. BP  146/74     2 Minute Post BP  134/70       Interval HR   1 Minute HR  87     2 Minute HR  99     3 Minute  HR  77     4 Minute HR  62     5 Minute HR  63     6 Minute HR  103     2 Minute Post HR  67     Interval Heart Rate?  Yes       Interval Oxygen   Interval Oxygen?  Yes     Baseline Oxygen Saturation %  94 %     1 Minute Oxygen Saturation %  93 %     1 Minute Liters of Oxygen  0 L Room Air     2 Minute Oxygen Saturation %  92 %     2 Minute Liters of Oxygen  0 L     3 Minute Oxygen Saturation %  91 %     3 Minute Liters of Oxygen  0 L     4 Minute Oxygen Saturation %  97 %     4 Minute Liters of Oxygen  0 L     5 Minute Oxygen Saturation %  89 %     5 Minute Liters of Oxygen  0 L     6 Minute Oxygen Saturation %  88 %     6 Minute Liters of Oxygen  0 L     2 Minute Post Oxygen Saturation %  94 %     2 Minute Post Liters of Oxygen  0 L       Oxygen Initial Assessment: Oxygen Initial Assessment - 04/08/19 1317      Home Oxygen   Home Oxygen Device  None    Sleep Oxygen Prescription  None    Home Exercise Oxygen Prescription  None    Home at Rest Exercise Oxygen Prescription  None      Initial 6 min Walk   Oxygen Used  None      Program Oxygen Prescription   Program Oxygen Prescription  None      Intervention   Short Term Goals  To learn and understand importance of maintaining oxygen saturations>88%;To learn and demonstrate proper pursed lip breathing techniques or other breathing techniques.;To  learn and understand importance of monitoring SPO2 with pulse oximeter and demonstrate accurate use of the pulse oximeter.    Long  Term Goals  Maintenance of O2 saturations>88%;Verbalizes importance of monitoring SPO2 with pulse oximeter and return demonstration;Exhibits proper breathing techniques, such as pursed lip breathing or other method taught during program session       Oxygen Re-Evaluation: Oxygen Re-Evaluation    Row Name 04/11/19 1137             Program Oxygen Prescription   Program Oxygen Prescription  None         Home Oxygen   Home Oxygen Device  None        Sleep Oxygen Prescription  None       Home Exercise Oxygen Prescription  None       Home at Rest Exercise Oxygen Prescription  None         Goals/Expected Outcomes   Short Term Goals  To learn and understand importance of maintaining oxygen saturations>88%;To learn and demonstrate proper pursed lip breathing techniques or other breathing techniques.;To learn and understand importance of monitoring SPO2 with pulse oximeter and demonstrate accurate use of the pulse oximeter.       Long  Term Goals  Maintenance of O2 saturations>88%;Verbalizes importance of monitoring SPO2 with pulse oximeter and return demonstration;Exhibits proper breathing techniques, such as pursed lip breathing or other method taught during program session       Comments  Reviewed PLB technique with pt.  Talked about how it work and it's important to maintaining his exercise saturations.       Goals/Expected Outcomes  Short: Become more profiecient at using PLB.   Long: Become independent at using PLB.          Oxygen Discharge (Final Oxygen Re-Evaluation): Oxygen Re-Evaluation - 04/11/19 1137      Program Oxygen Prescription   Program Oxygen Prescription  None      Home Oxygen   Home Oxygen Device  None    Sleep Oxygen Prescription  None    Home Exercise Oxygen Prescription  None    Home at Rest Exercise Oxygen Prescription  None      Goals/Expected Outcomes   Short Term Goals  To learn and understand importance of maintaining oxygen saturations>88%;To learn and demonstrate proper pursed lip breathing techniques or other breathing techniques.;To learn and understand importance of monitoring SPO2 with pulse oximeter and demonstrate accurate use of the pulse oximeter.    Long  Term Goals  Maintenance of O2 saturations>88%;Verbalizes importance of monitoring SPO2 with pulse oximeter and return demonstration;Exhibits proper breathing techniques, such as pursed lip breathing or other method taught during program  session    Comments  Reviewed PLB technique with pt.  Talked about how it work and it's important to maintaining his exercise saturations.    Goals/Expected Outcomes  Short: Become more profiecient at using PLB.   Long: Become independent at using PLB.       Initial Exercise Prescription: Initial Exercise Prescription - 04/08/19 1300      Date of Initial Exercise RX and Referring Provider   Date  04/08/19    Referring Provider  Ancil Linsey MD      Treadmill   MPH  1.9    Grade  0.5    Minutes  15    METs  2.59      Recumbant Bike   Level  1    RPM  50    Watts  10    Minutes  15    METs  2.5      NuStep   Level  1    SPM  80    Minutes  15    METs  2.5      Recumbant Elliptical   Level  1    RPM  50    Minutes  15    METs  2.5      Prescription Details   Frequency (times per week)  3    Duration  Progress to 30 minutes of continuous aerobic without signs/symptoms of physical distress      Intensity   THRR 40-80% of Max Heartrate  95-127    Ratings of Perceived Exertion  11-13    Perceived Dyspnea  0-4      Progression   Progression  Continue to progress workloads to maintain intensity without signs/symptoms of physical distress.      Resistance Training   Training Prescription  Yes    Weight  3 lb    Reps  10-15       Perform Capillary Blood Glucose checks as needed.  Exercise Prescription Changes: Exercise Prescription Changes    Row Name 04/08/19 1300 04/22/19 1000 04/25/19 1200 05/07/19 1200       Response to Exercise   Blood Pressure (Admit)  122/64  126/74  --  134/72    Blood Pressure (Exercise)  142/64  146/80  --  154/70    Blood Pressure (Exit)  134/70  106/64  --  120/60    Heart Rate (Admit)  63 bpm  69 bpm  --  66 bpm    Heart Rate (Exercise)  103 bpm  107 bpm  --  110 bpm    Heart Rate (Exit)  64 bpm  68 bpm  --  71 bpm    Oxygen Saturation (Admit)  94 %  95 %  --  94 %    Oxygen Saturation (Exercise)  88 %  89 %  --  84 %     Oxygen Saturation (Exit)  94 %  92 %  --  95 %    Rating of Perceived Exertion (Exercise)  14  12  --  15    Perceived Dyspnea (Exercise)  3  3  --  2    Symptoms  SOB  --  --  SOB, low sats on TM    Comments  walk test results  --  --  --    Duration  --  Continue with 30 min of aerobic exercise without signs/symptoms of physical distress.  --  Continue with 30 min of aerobic exercise without signs/symptoms of physical distress.    Intensity  --  THRR unchanged  --  THRR unchanged      Progression   Progression  --  Continue to progress workloads to maintain intensity without signs/symptoms of physical distress.  --  Continue to progress workloads to maintain intensity without signs/symptoms of physical distress.    Average METs  --  2.7  --  2.76      Resistance Training   Training Prescription  --  Yes  --  Yes    Weight  --  3 lb  --  3 lb    Reps  --  10-15  --  10-15      Interval Training   Interval Training  --  --  --  No      Treadmill  MPH  --  --  --  1.9    Grade  --  --  --  0.5    Minutes  --  --  --  15    METs  --  --  --  2.59      NuStep   Level  --  3  --  5    SPM  --  80  --  --    Minutes  --  15  --  15    METs  --  4.1  --  3.7      Recumbant Elliptical   Level  --  1  --  1.5    RPM  --  50  --  --    Minutes  --  15  --  15    METs  --  1.8  --  2      Home Exercise Plan   Plans to continue exercise at  --  --  Home (comment)  Home (comment)    Frequency  --  --  Add 1 additional day to program exercise sessions.  Add 1 additional day to program exercise sessions.    Initial Home Exercises Provided  --  --  04/25/19  04/25/19       Exercise Comments: Exercise Comments    Row Name 05/07/19 1025           Exercise Comments  HR up 140 at start of warm up Arrival HR was 110. Stopped Ke[place] and resolved to 128. Looking at chart he has not had HR this high in past. Placed on tele monitor and A FIb seen. Called MD office to report and get  direction for care. Lawayne actually had appointment this AM with MD. Was advised to send him to office now. Escorted to office.          Exercise Goals and Review: Exercise Goals    Row Name 04/08/19 1312             Exercise Goals   Increase Physical Activity  Yes       Intervention  Provide advice, education, support and counseling about physical activity/exercise needs.;Develop an individualized exercise prescription for aerobic and resistive training based on initial evaluation findings, risk stratification, comorbidities and participant's personal goals.       Expected Outcomes  Short Term: Attend rehab on a regular basis to increase amount of physical activity.;Long Term: Exercising regularly at least 3-5 days a week.;Long Term: Add in home exercise to make exercise part of routine and to increase amount of physical activity.       Increase Strength and Stamina  Yes       Intervention  Provide advice, education, support and counseling about physical activity/exercise needs.;Develop an individualized exercise prescription for aerobic and resistive training based on initial evaluation findings, risk stratification, comorbidities and participant's personal goals.       Expected Outcomes  Short Term: Increase workloads from initial exercise prescription for resistance, speed, and METs.;Short Term: Perform resistance training exercises routinely during rehab and add in resistance training at home;Long Term: Improve cardiorespiratory fitness, muscular endurance and strength as measured by increased METs and functional capacity (6MWT)       Able to understand and use rate of perceived exertion (RPE) scale  Yes       Intervention  Provide education and explanation on how to use RPE scale       Expected Outcomes  Short  Term: Able to use RPE daily in rehab to express subjective intensity level;Long Term:  Able to use RPE to guide intensity level when exercising independently       Able to  understand and use Dyspnea scale  Yes       Intervention  Provide education and explanation on how to use Dyspnea scale       Expected Outcomes  Short Term: Able to use Dyspnea scale daily in rehab to express subjective sense of shortness of breath during exertion;Long Term: Able to use Dyspnea scale to guide intensity level when exercising independently       Knowledge and understanding of Target Heart Rate Range (THRR)  Yes       Intervention  Provide education and explanation of THRR including how the numbers were predicted and where they are located for reference       Expected Outcomes  Short Term: Able to state/look up THRR;Long Term: Able to use THRR to govern intensity when exercising independently;Short Term: Able to use daily as guideline for intensity in rehab       Able to check pulse independently  Yes       Intervention  Provide education and demonstration on how to check pulse in carotid and radial arteries.;Review the importance of being able to check your own pulse for safety during independent exercise       Expected Outcomes  Short Term: Able to explain why pulse checking is important during independent exercise;Long Term: Able to check pulse independently and accurately       Understanding of Exercise Prescription  Yes       Intervention  Provide education, explanation, and written materials on patient's individual exercise prescription       Expected Outcomes  Short Term: Able to explain program exercise prescription;Long Term: Able to explain home exercise prescription to exercise independently          Exercise Goals Re-Evaluation : Exercise Goals Re-Evaluation    Row Name 04/11/19 1134 04/22/19 1008 04/25/19 1205 05/07/19 1240 06/06/19 0734     Exercise Goal Re-Evaluation   Exercise Goals Review  Increase Physical Activity;Able to understand and use rate of perceived exertion (RPE) scale;Knowledge and understanding of Target Heart Rate Range (THRR);Understanding of  Exercise Prescription;Increase Strength and Stamina;Able to understand and use Dyspnea scale;Able to check pulse independently  Increase Physical Activity;Increase Strength and Stamina;Able to understand and use rate of perceived exertion (RPE) scale;Able to understand and use Dyspnea scale;Knowledge and understanding of Target Heart Rate Range (THRR);Able to check pulse independently;Understanding of Exercise Prescription  Increase Physical Activity;Increase Strength and Stamina;Able to understand and use rate of perceived exertion (RPE) scale;Able to understand and use Dyspnea scale;Knowledge and understanding of Target Heart Rate Range (THRR);Able to check pulse independently;Understanding of Exercise Prescription  Increase Physical Activity;Increase Strength and Stamina;Understanding of Exercise Prescription  --   Comments  Reviewed RPE scale, THR and program prescription with pt today.  Pt voiced understanding and was given a copy of goals to take home.  Marcquis is doing well in rehab.  He has moved up level on T4.  Oxygen stays above 88 % during exercise.  Reviewed home exercise with pt today.  Pt plans to  for exercise.  Reviewed THR, pulse, RPE, sign and symptoms, NTG use, and when to call 911 or MD.  Also discussed weather considerations and indoor options.  Pt voiced understanding.  Nichalos has been doing well in rehab.  He is now up to  level 5 on the NuStep adn 1.5 on the recumbent elliptical.  His oxygen levels have be dropping on the treadmill and we will continue to keep an eye on them. Today, his exericse was stopped due to a high HR and afib. He was sent to MD office for review.  We will continue to monitor his progress.  Out since last review.   Expected Outcomes  Short: Use RPE daily to regulate intensity. Long: Follow program prescription in THR.  Short - continue to attend consistently Long -  increase overall stamina  Short - exercise at home on days not at [place] - improve overall MET level   Short: Continue to monitor oxygen saturations on treadmill. Long: Continue to improve stamina.  --   Row Name 06/17/19 1056             Exercise Goal Re-Evaluation   Comments  Out since last review.          Discharge Exercise Prescription (Final Exercise Prescription Changes): Exercise Prescription Changes - 05/07/19 1200      Response to Exercise   Blood Pressure (Admit)  134/72    Blood Pressure (Exercise)  154/70    Blood Pressure (Exit)  120/60    Heart Rate (Admit)  66 bpm    Heart Rate (Exercise)  110 bpm    Heart Rate (Exit)  71 bpm    Oxygen Saturation (Admit)  94 %    Oxygen Saturation (Exercise)  84 %    Oxygen Saturation (Exit)  95 %    Rating of Perceived Exertion (Exercise)  15    Perceived Dyspnea (Exercise)  2    Symptoms  SOB, low sats on TM    Duration  Continue with 30 min of aerobic exercise without signs/symptoms of physical distress.    Intensity  THRR unchanged      Progression   Progression  Continue to progress workloads to maintain intensity without signs/symptoms of physical distress.    Average METs  2.76      Resistance Training   Training Prescription  Yes    Weight  3 lb    Reps  10-15      Interval Training   Interval Training  No      Treadmill   MPH  1.9    Grade  0.5    Minutes  15    METs  2.59      NuStep   Level  5    Minutes  15    METs  3.7      Recumbant Elliptical   Level  1.5    Minutes  15    METs  2      Home Exercise Plan   Plans to continue exercise at  Home (comment)    Frequency  Add 1 additional day to program exercise sessions.    Initial Home Exercises Provided  04/25/19       Nutrition:  Target Goals: Understanding of nutrition guidelines, daily intake of sodium <1590m, cholesterol <2057m calories 30% from fat and 7% or less from saturated fats, daily to have 5 or more servings of fruits and vegetables.  Biometrics: Pre Biometrics - 04/08/19 1313      Pre Biometrics   Height  _0  (1.676  m)    Weight  159 lb 8 oz (72.3 kg)    BMI (Calculated)  25.76    Single Leg Stand  1.63 seconds  Nutrition Therapy Plan and Nutrition Goals: Nutrition Therapy & Goals - 04/22/19 1202      Nutrition Therapy   Diet  Low Na, HH diet    Protein (specify units)  55-60g    Fiber  30 grams    Whole Grain Foods  3 servings    Saturated Fats  12 max. grams    Fruits and Vegetables  5 servings/day    Sodium  1.5 grams      Personal Nutrition Goals   Nutrition Goal  ST: continue current diet LT: Improve breathing and reduce SOB.    Comments  B: coffee (sugar + half n half) + bowl cereal (raisin bran w/ whole milk) or sausage/ham biscuit. D: sandwich (meat, cheese, on whole wheat) pepsi and sometimes chips and snack cakes or salad or leftovers from family or TV dinners. water during the day, doesn't drink as much since it got cold. Pt reports not wanting to make any changes. Discussed HH eating and making sure getting enough kcal and protein. Pt reports being weight stable, but energy is low. Will continue to monitor, pt does not want to make any chanegs at this time.      Intervention Plan   Intervention  Prescribe, educate and counsel regarding individualized specific dietary modifications aiming towards targeted core components such as weight, hypertension, lipid management, diabetes, heart failure and other comorbidities.;Nutrition handout(s) given to patient.    Expected Outcomes  Short Term Goal: Understand basic principles of dietary content, such as calories, fat, sodium, cholesterol and nutrients.;Short Term Goal: A plan has been developed with personal nutrition goals set during dietitian appointment.;Long Term Goal: Adherence to prescribed nutrition plan.       Nutrition Assessments: Nutrition Assessments - 04/08/19 1138      MEDFICTS Scores   Pre Score  95       Nutrition Goals Re-Evaluation:   Nutrition Goals Discharge (Final Nutrition Goals  Re-Evaluation):   Psychosocial: Target Goals: Acknowledge presence or absence of significant depression and/or stress, maximize coping skills, provide positive support system. Participant is able to verbalize types and ability to use techniques and skills needed for reducing stress and depression.   Initial Review & Psychosocial Screening: Initial Psych Review & Screening - 04/04/19 1308      Initial Review   Current issues with  Current Stress Concerns    Source of Stress Concerns  Financial      Family Dynamics   Good Support System?  Yes      Barriers   Psychosocial barriers to participate in program  There are no identifiable barriers or psychosocial needs.      Screening Interventions   Interventions  Encouraged to exercise;To provide support and resources with identified psychosocial needs;Provide feedback about the scores to participant    Expected Outcomes  Short Term goal: Utilizing psychosocial counselor, staff and physician to assist with identification of specific Stressors or current issues interfering with healing process. Setting desired goal for each stressor or current issue identified.;Long Term Goal: Stressors or current issues are controlled or eliminated.;Short Term goal: Identification and review with participant of any Quality of Life or Depression concerns found by scoring the questionnaire.;Long Term goal: The participant improves quality of Life and PHQ9 Scores as seen by post scores and/or verbalization of changes       Quality of Life Scores:  Scores of 19 and below usually indicate a poorer quality of life in these areas.  A difference of  2-3 points is  a clinically meaningful difference.  A difference of 2-3 points in the total score of the Quality of Life Index has been associated with significant improvement in overall quality of life, self-image, physical symptoms, and general health in studies assessing change in quality of life.  PHQ-9: Recent Review  Flowsheet Data    Depression screen [facility] 2/9 04/08/2019   Decreased Interest 0   Down, Depressed, Hopeless 0   PHQ - 2 Score 0   Altered sleeping 0   Tired, decreased energy 1   Change in appetite 1   Feeling bad or failure about yourself  0   Trouble concentrating 0   Moving slowly or fidgety/restless 0   Suicidal thoughts 0   PHQ-9 Score 2   Difficult doing work/chores Not difficult at all     Interpretation of Total Score  Total Score Depression Severity:  1-4 = Minimal depression, 5-9 = Mild depression, 10-14 = Moderate depression, 15-19 = Moderately severe depression, 20-27 = Severe depression   Psychosocial Evaluation and Intervention: Psychosocial Evaluation - 04/04/19 1355      Psychosocial Evaluation & Interventions   Interventions  Stress management education;Encouraged to exercise with the program and follow exercise prescription    Comments  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 He hopes this program helps some with his stamina. He does have someone that helps out with some of the farm duties, but paying him is difficult sometimes. His girlfriend is very supportive and his son lives down the road from him if he needs anything    Expected Outcomes  Short: attend [facility] to increase stamina Long: develop self care habits.    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    Expected Outcomes  Short - continue to attend consistently Long - manage stress on his own       Education: Education Goals: Education classes will be provided on a weekly basis, covering required topics. Participant will state understanding/return demonstration of topics presented.  Learning Barriers/Preferences: Learning Barriers/Preferences - 04/04/19 1308      Learning Barriers/Preferences   Learning Barriers  None    Learning Preferences  None       Education Topics:  Initial Evaluation Education: - Verbal, written and demonstration of respiratory meds, oximetry and breathing techniques. Instruction on use of nebulizers and MDIs and importance of monitoring MDI activations.   Pulmonary Rehab from 04/23/2019 in  [facility]  Date  04/08/19  Educator  [facility]  Instruction Review Code  1- Verbalizes Understanding      General Nutrition Guidelines/Fats and Fiber: -Group instruction provided by verbal, written material, models and posters to present the general guidelines for heart healthy nutrition. Gives an explanation and review of dietary fats and fiber.   Controlling Sodium/Reading Food Labels: -Group verbal and written material supporting the discussion of sodium use in heart healthy nutrition. Review and explanation with models, verbal and written materials for utilization of the food label.   Exercise Physiology & General Exercise Guidelines: - Group verbal and written instruction with models to review the exercise physiology of the cardiovascular system and associated critical values. Provides general exercise guidelines with specific guidelines to those with heart or lung disease.    Aerobic  Exercise & Resistance Training: - Gives group verbal and written instruction on the various components of exercise. Focuses on aerobic and resistive training programs and the benefits of this training and how to safely progress through these programs.   Flexibility, Balance, Mind/Body Relaxation: Provides group verbal/written instruction on the benefits of flexibility and balance training, including mind/body exercise modes such as yoga, pilates and tai chi.  Demonstration and skill practice provided.   Pulmonary Rehab from 04/23/2019 in [facility]  Date  04/23/19  Educator  [facility]  Instruction Review Code  1- Verbalizes Understanding      Stress and Anxiety: - Provides group verbal and written instruction about the health risks of elevated stress and causes of high stress.  Discuss the correlation between heart/lung disease and anxiety and treatment options. Review healthy ways to manage with stress and anxiety.   Depression: - Provides group verbal and written instruction on the correlation between heart/lung disease and depressed mood, treatment options, and the stigmas associated with seeking treatment.   Exercise & Equipment Safety: - Individual verbal instruction and demonstration of equipment use and safety with use of the equipment.   Pulmonary Rehab from 04/23/2019 in [facility]  Date  04/08/19  Educator  [facility]  Instruction Review Code  1- Verbalizes Understanding      Infection Prevention: - Provides verbal and written material to individual with discussion of infection control including proper hand washing and proper equipment cleaning during exercise session.   Pulmonary Rehab from 04/23/2019 in [facility]  Date  04/08/19  Educator  [facility]  Instruction Review Code  1- Verbalizes Understanding      Falls Prevention: - Provides verbal and written material to individual with discussion of falls prevention and  safety.   Pulmonary Rehab from 04/23/2019 in [facility]  Date  04/08/19  Educator  [facility]  Instruction Review Code  1- Verbalizes Understanding      Diabetes: - Individual verbal and written instruction to review signs/symptoms of diabetes, desired ranges of glucose level fasting, after meals and with exercise. Advice that pre and post exercise glucose checks will be done for 3 sessions at entry of program.   Chronic Lung Diseases: - Group verbal and written instruction to review updates, respiratory medications, advancements in procedures and treatments. Discuss use of supplemental oxygen including available portable oxygen systems, continuous and intermittent flow rates, concentrators, personal use and safety guidelines. Review proper use of inhaler and spacers. Provide informative websites for self-education.    Pulmonary Rehab from 04/23/2019 in [facility]  Date  04/23/19  Educator  [facility]  Instruction Review Code  1- Verbalizes Understanding      Energy Conservation: - Provide group verbal and written instruction for methods to conserve energy, plan and organize activities. Instruct on pacing techniques, use of adaptive equipment and posture/positioning to relieve shortness of breath.   Triggers and Exacerbations: - Group verbal and written instruction to review types of environmental triggers and ways to prevent exacerbations. Discuss weather changes, air quality and the benefits of nasal washing. Review warning signs and symptoms to help prevent infections. Discuss techniques for effective airway clearance, coughing, and vibrations.   AED/CPR: - Group verbal and written instruction with the use of models to demonstrate the basic use of the AED with the basic ABC's of resuscitation.   Anatomy and Physiology of the Lungs: - Group verbal and written instruction with the use of models to provide basic lung anatomy and physiology related to  function, structure and complications of lung disease.   Anatomy & Physiology of the Heart: - Group verbal and written instruction and models provide basic cardiac anatomy and physiology, with the coronary electrical and arterial systems. Review of Valvular disease and Heart Failure   Cardiac Medications: - Group verbal and written instruction to review commonly prescribed medications for heart disease. Reviews the medication, class of the drug, and side effects.   Know Your Numbers and Risk Factors: -Group verbal and written instruction about important numbers in your health.  Discussion of what are risk factors and how they play a role in the disease process.  Review of Cholesterol, Blood Pressure, Diabetes, and BMI and the role they play in your overall health.   Sleep Hygiene: -Provides group verbal and written instruction about how sleep can affect your health.  Define sleep hygiene, discuss sleep cycles and impact of sleep habits. Review good sleep hygiene tips.    Other: -Provides group and verbal instruction on various topics (see comments)    Knowledge Questionnaire Score: Knowledge Questionnaire Score - 04/08/19 1314      Knowledge Questionnaire Score   Pre Score  13/16 Education Focus: Exercise, PLB, oxygen safety        Core Components/Risk Factors/Patient Goals at Admission: Personal Goals and Risk Factors at Admission - 04/08/19 1315      Core Components/Risk Factors/Patient Goals on Admission    Weight Management  Yes;Weight Loss    Intervention  Weight Management: Develop a combined nutrition and exercise program designed to reach desired caloric intake, while maintaining appropriate intake of nutrient and fiber, sodium and fats, and appropriate energy expenditure required for the weight goal.;Weight Management: Provide education and appropriate resources to help participant work on and attain dietary goals.    Admit Weight  159 lb 8 oz (72.3 kg)    Goal Weight:  Short Term  154 lb (69.9 kg)    Goal Weight: Long Term  150 lb (68 kg)    Expected Outcomes  Short Term: Continue to assess and modify interventions until short term weight is achieved;Long Term: Adherence to nutrition and physical activity/exercise program aimed toward attainment of established weight goal;Weight Loss: Understanding of general recommendations for a balanced deficit meal plan, which promotes 1-2 lb weight loss per week and includes a negative energy balance of [phone] kcal/d;Understanding recommendations for meals to include 15-35% energy as protein, 25-35% energy from fat, 35-60% energy from carbohydrates, less than 284m of dietary cholesterol, 20-35 gm of total fiber daily;Understanding of distribution of calorie intake throughout the day with the consumption of 4-5  meals/snacks    Improve shortness of breath with ADL's  Yes    Intervention  Provide education, individualized exercise plan and daily activity instruction to help decrease symptoms of SOB with activities of daily living.    Expected Outcomes  Short Term: Improve cardiorespiratory fitness to achieve a reduction of symptoms when performing ADLs;Long Term: Be able to perform more ADLs without symptoms or delay the onset of symptoms    Lipids  Yes    Intervention  Provide education and support for participant on nutrition & aerobic/resistive exercise along with prescribed medications to achieve LDL <22m, HDL >471m    Expected Outcomes  Short Term: Participant states understanding of desired cholesterol values and is compliant with medications prescribed. Participant is following exercise prescription and nutrition guidelines.;Long Term: Cholesterol controlled with medications as prescribed, with individualized exercise RX and with personalized nutrition plan. Value goals: LDL < 7081mHDL > 40 mg.       Core Components/Risk Factors/Patient Goals Review:  Goals and Risk Factor Review    Row Name 05/05/19 1110              Core Components/Risk Factors/Patient Goals Review   Personal Goals Review  Weight Management/Obesity;Increase knowledge of respiratory medications and ability to use respiratory devices properly.;Improve shortness of breath with ADL's       Review  WilDeakon taking all meds as directed.  He feels he can do more ADLs with less shortness of breath.  He hasnt been exercising at home.  He is able to do yard work around his home when the weather is nice.       Expected Outcomes  Short - attend LW consistently Long - improve stamina overall          Core Components/Risk Factors/Patient Goals at Discharge (Final Review):  Goals and Risk Factor Review - 05/05/19 1110      Core Components/Risk Factors/Patient Goals Review   Personal Goals Review  Weight Management/Obesity;Increase knowledge of respiratory medications and ability to use respiratory devices properly.;Improve shortness of breath with ADL's    Review  WilKarman taking all meds as directed.  He feels he can do more ADLs with less shortness of breath.  He hasnt been exercising at home.  He is able to do yard work around his home when the weather is nice.    Expected Outcomes  Short - attend LW consistently Long - improve stamina overall       ITP Comments: ITP Comments    Row Name 04/04/19 1321 04/08/19 1258 04/11/19 1133 04/22/19 1227 04/23/19 1030   ITP Comments  Initial telephone orientation completed. Diagnosis completed 9/23. EP orientation scheduled for 11/17 at 9:30  Completed 6MWT and gym orientation.  Initial ITP created and sent for review to Dr. MarEmily Filbertedical Director.  First full day of exercise!  Patient was oriented to gym and equipment including functions, settings, policies, and procedures.  Patient's individual exercise prescription and treatment plan were reviewed.  All starting workloads were established based on the results of the 6 minute walk test done at initial orientation visit.  The plan for exercise  progression was also introduced and progression will be customized based on patient's performance and goals.  Completed intial RD eval  30 day review competed . ITP sent to Dr MarEmily Filbertr review, changes as needed and ITP approval signature.   RowPulaskime 05/07/19 1024 05/21/19 0908 05/22/19 1332 06/06/19 0731 06/16/19 1649   ITP Comments  HR  up 140 at start of warm up Arrival HR was 110. Stopped Lillian and resolved to 128. Looking at chart he has not had HR this high in past. Placed on tele monitor and A FIb seen. Called MD office to report and get direction for care. Odis actually had appointment this AM with MD. Was advised to send him to office now. Escorted to office.  30 day review competed . ITP sent to Dr Emily Filbert for review, changes as needed and ITP approval signature. Shahil called this week to say his physician wants him to stay out of rehab until further notice.  See note from 05/18/2019 - Lew will stay out until he is cleared by his Dr to return  Taegan has been out since 05/07/19 for some heart issues.  He had a consult for afib with Dr. Ubaldo Glassing on 05/27/19 for afib.  They did not note when he could return to rehab in note.  Nicholaus has not returned to [facility].   Stanley Name 06/17/19 1056 06/18/19 1441 06/25/19 1228       ITP Comments  Pt has been out for evaluation for afib.  Called to check in, left message.  OV notes show medication change and no follow up for 3 months.  Pt should be able to return.  Out since 05/07/19.  30 day review completed. ITP sent to Dr. Emily Filbert, Medical Director of Cardiac and Pulmonary Rehab. Continue with ITP unless changes are made by physician.  Department operating under reduced schedule until further notice by request from hospital leadership.  Pt out since 05/07/19.  Saamir called program to be discharged.  He stated that his doctor told him to stay away from Cardiac Rehab.  Discharge completed.        Comments: Discharged per patient request

## 2019-07-15 ENCOUNTER — Other Ambulatory Visit: Payer: Self-pay | Admitting: Specialist

## 2019-07-15 DIAGNOSIS — J849 Interstitial pulmonary disease, unspecified: Secondary | ICD-10-CM

## 2019-08-04 ENCOUNTER — Other Ambulatory Visit: Payer: Self-pay

## 2019-08-04 ENCOUNTER — Other Ambulatory Visit: Payer: Self-pay | Admitting: Specialist

## 2019-08-04 ENCOUNTER — Ambulatory Visit
Admission: RE | Admit: 2019-08-04 | Discharge: 2019-08-04 | Disposition: A | Discharge status: Home / Self Care | Payer: Medicare Other | Source: Ambulatory Visit | Attending: Specialist | Admitting: Specialist

## 2019-08-04 DIAGNOSIS — J849 Interstitial pulmonary disease, unspecified: Secondary | ICD-10-CM

## 2020-09-17 IMAGING — CT CT CHEST HIGH RESOLUTION W/O CM
2 of 5 series · 15 of 36 positions shown, 18 images · non-contrast
Comparison: 04/25/2018.

CLINICAL DATA: Chronic shortness of breath and cough.

EXAM:
CT CHEST WITHOUT CONTRAST
TECHNIQUE: Multidetector CT imaging of the chest was performed following the
standard protocol without intravenous contrast. High resolution
imaging of the lungs, as well as inspiratory and expiratory imaging,
was performed.

[Series 2: thorax · axial · 0.73mm/px · z∈[+16,+274]mm · 12 of 143 slices shown, 15 images]
[im 7/143  mediastinal]
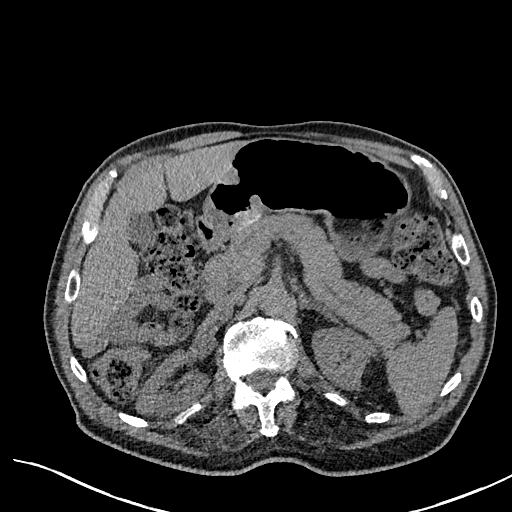
[im 7/143  lung]
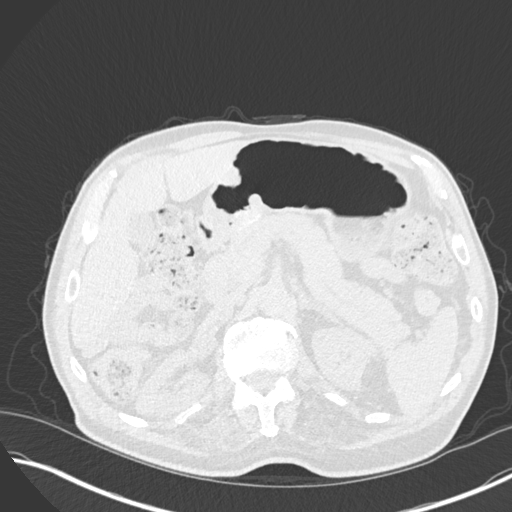
[im 19/143  lung]
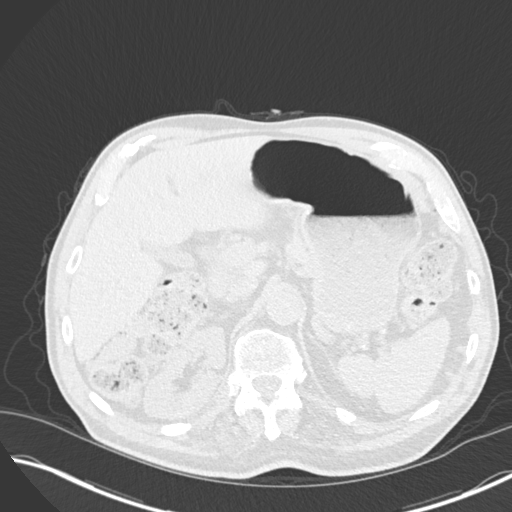
[im 31/143  lung]
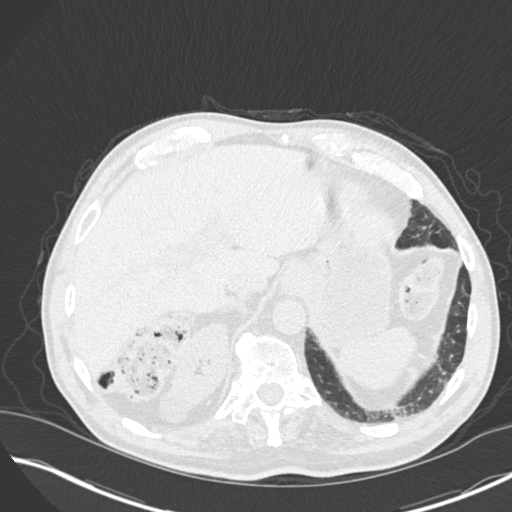
[im 44/143  lung]
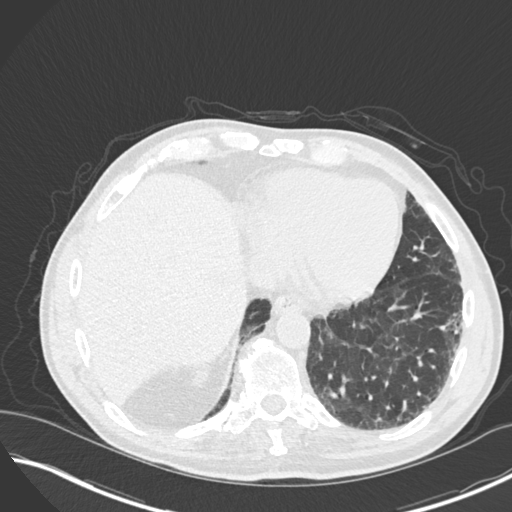
[im 56/143  mediastinal]
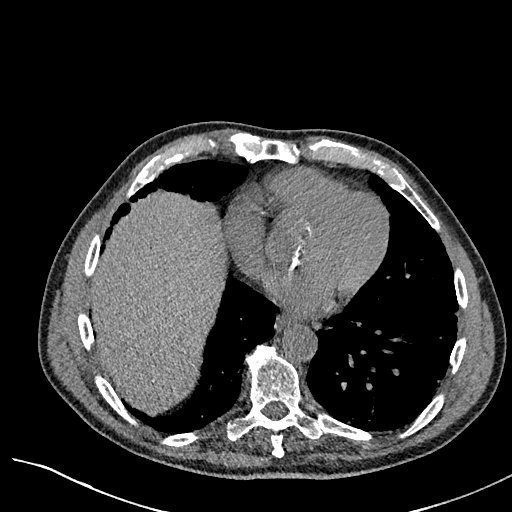
[im 56/143  lung]
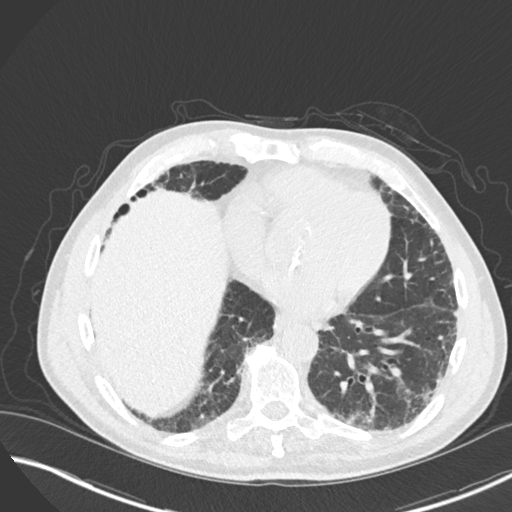
[im 68/143  lung]
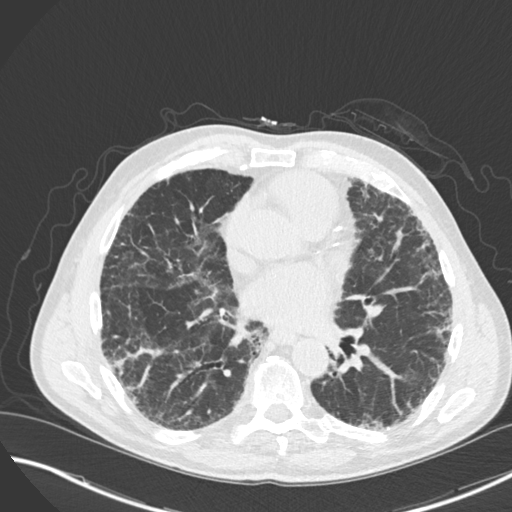
[im 75/143  lung]
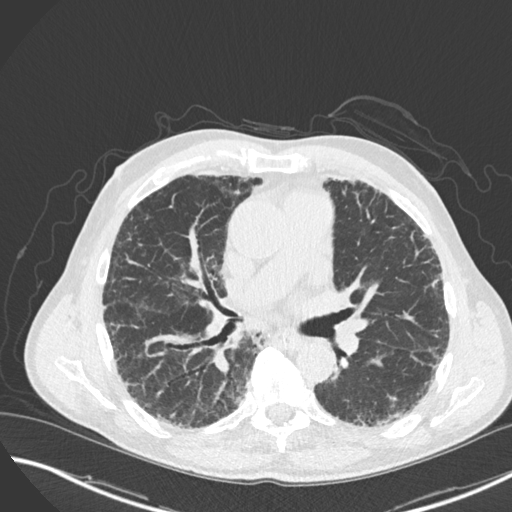
[im 87/143  lung]
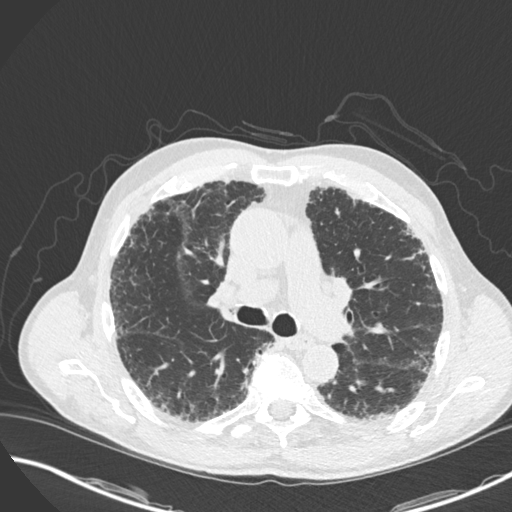
[im 99/143  mediastinal]
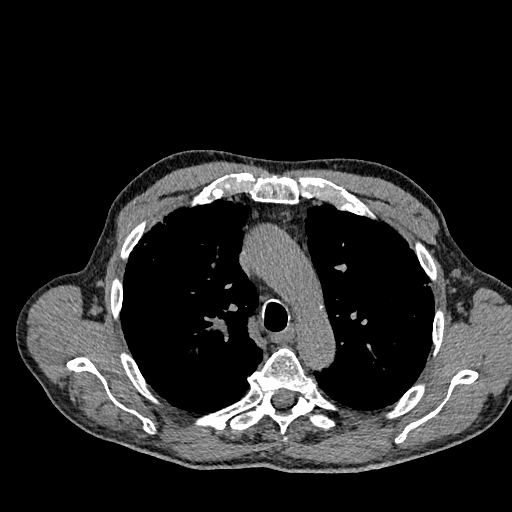
[im 99/143  lung]
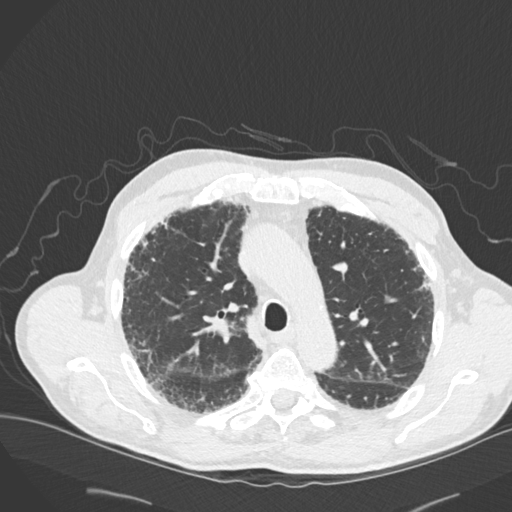
[im 112/143  lung]
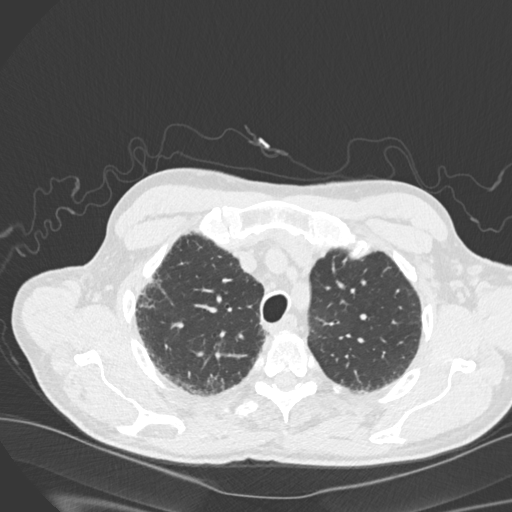
[im 124/143  lung]
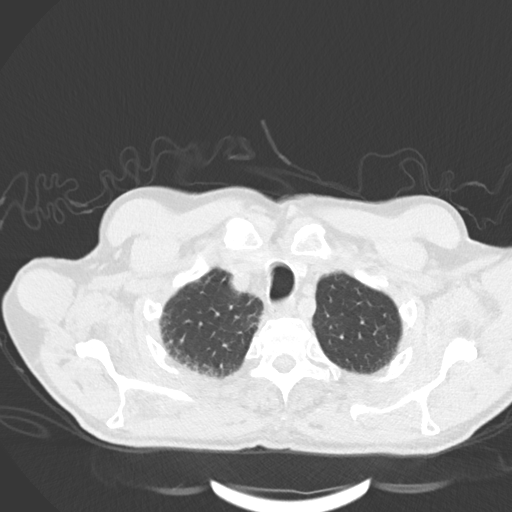
[im 136/143  lung]
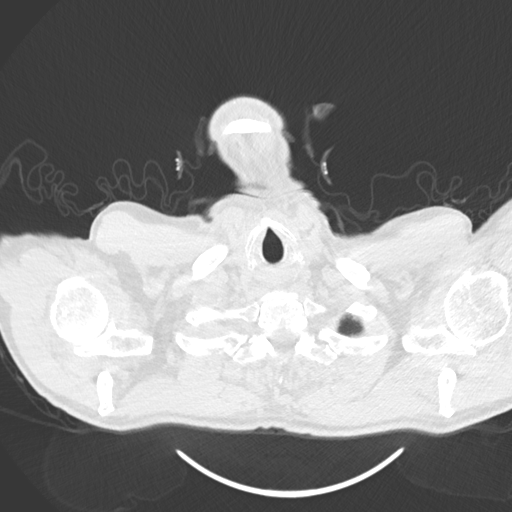

[Series 8: coronal · coronal · 0.58mm/px · 3 of 96 slices shown]
[im 20/96  lung]
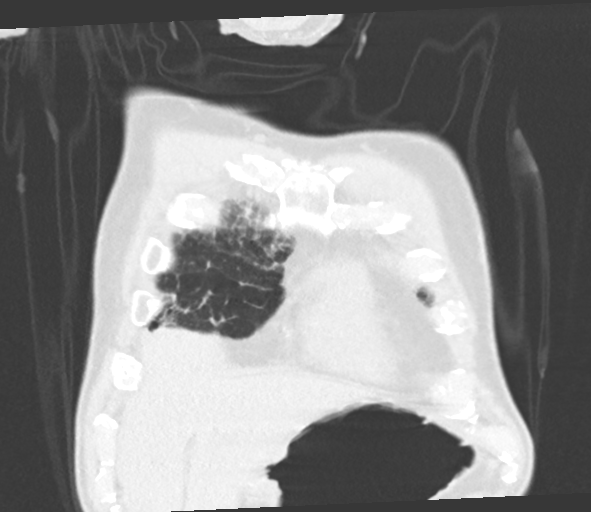
[im 39/96  lung]
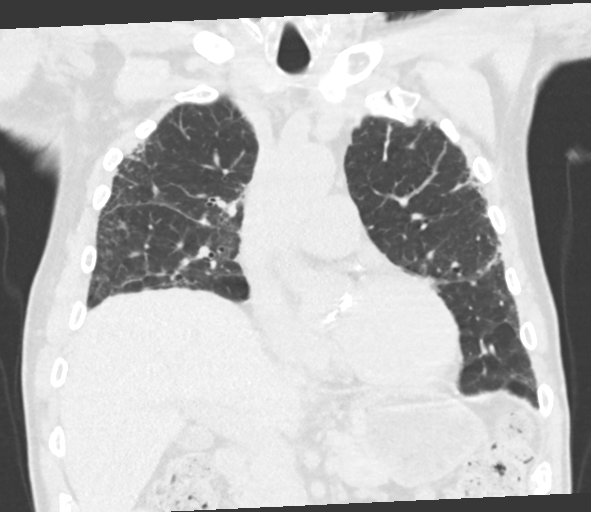
[im 58/96  lung]
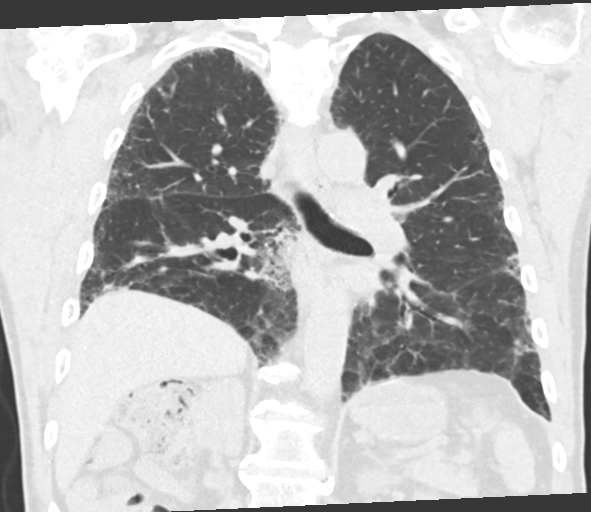

[15 of 36 positions shown; findings below may reference images not displayed]

FINDINGS: Cardiovascular: Atherosclerotic calcification of the aorta, aortic
valve and coronary arteries. Heart size normal. No pericardial
effusion.

Mediastinum/Nodes: No pathologically enlarged mediastinal or
axillary lymph nodes. Hilar regions are difficult to definitively
evaluate but appear grossly unremarkable. Esophagus is grossly
unremarkable.

Lungs/Pleura: Peripheral pattern of subpleural reticulation and
ground-glass with traction bronchiectasis/bronchiolectasis and
probable areas of honeycombing, unchanged from 04/25/2018. No
definite zonal predominance. 6 mm left upper lobe nodule (3/44),
stable. No pleural fluid. Airway is unremarkable. No air trapping.

Upper Abdomen: Visualized portions of the liver, gallbladder and
adrenal glands are unremarkable. Low-attenuation lesion in right
kidney measures 2.5 cm and is likely a cyst. Scarring in the left
kidney. Subcentimeter low-attenuation lesion in the left kidney is
too small characterize but statistically, a cyst is likely.
Visualized portions of spleen, pancreas, stomach and bowel are
unremarkable. No upper abdominal adenopathy.

Musculoskeletal: Degenerative changes in the spine. No worrisome
lytic or sclerotic lesions.
IMPRESSION: 1. Pulmonary parenchymal pattern of fibrosis is unchanged from
04/25/2018 and is likely due to usual interstitial pneumonitis.
Fibrotic nonspecific interstitial pneumonitis is not excluded.
Findings are categorized as probable UIP per consensus guidelines:
Diagnosis of Idiopathic Pulmonary Fibrosis: An Official
ATS/ERS/JRS/ALAT Clinical Practice Guideline. Am J Respir Crit Care
Med Vol 198, Lunic 5, [id number]].
2. 6 mm left upper lobe nodule, stable and likely benign. Additional
follow-up in 1 year could be obtained for 2 years of documented
stability.
3. Aortic atherosclerosis ([id number]-EH5.5). Coronary artery
calcification.
4.

## 2021-03-25 DIAGNOSIS — Z23 Encounter for immunization: Secondary | ICD-10-CM | POA: Diagnosis not present

## 2021-11-22 ENCOUNTER — Emergency Department: Payer: Medicare Other

## 2021-11-22 ENCOUNTER — Other Ambulatory Visit: Payer: Self-pay

## 2021-11-22 ENCOUNTER — Emergency Department
Admission: EM | Admit: 2021-11-22 | Discharge: 2021-11-22 | Disposition: A | Discharge status: Home / Self Care | Payer: Medicare Other | Attending: Emergency Medicine | Admitting: Emergency Medicine

## 2021-11-22 ENCOUNTER — Encounter: Payer: Self-pay | Admitting: Emergency Medicine

## 2021-11-22 DIAGNOSIS — T148XXA Other injury of unspecified body region, initial encounter: Secondary | ICD-10-CM

## 2021-11-22 DIAGNOSIS — Z23 Encounter for immunization: Secondary | ICD-10-CM | POA: Insufficient documentation

## 2021-11-22 DIAGNOSIS — Y92007 Garden or yard of unspecified non-institutional (private) residence as the place of occurrence of the external cause: Secondary | ICD-10-CM | POA: Insufficient documentation

## 2021-11-22 DIAGNOSIS — I2510 Atherosclerotic heart disease of native coronary artery without angina pectoris: Secondary | ICD-10-CM | POA: Diagnosis not present

## 2021-11-22 DIAGNOSIS — S5002XA Contusion of left elbow, initial encounter: Secondary | ICD-10-CM | POA: Insufficient documentation

## 2021-11-22 DIAGNOSIS — S59902A Unspecified injury of left elbow, initial encounter: Secondary | ICD-10-CM | POA: Diagnosis present

## 2021-11-22 DIAGNOSIS — Z7901 Long term (current) use of anticoagulants: Secondary | ICD-10-CM | POA: Insufficient documentation

## 2021-11-22 DIAGNOSIS — W208XXA Other cause of strike by thrown, projected or falling object, initial encounter: Secondary | ICD-10-CM | POA: Diagnosis not present

## 2021-11-22 DIAGNOSIS — Z85828 Personal history of other malignant neoplasm of skin: Secondary | ICD-10-CM | POA: Diagnosis not present

## 2021-11-22 LAB — BASIC METABOLIC PANEL
Anion gap: 6 (ref 5–15)
BUN: 20 mg/dL (ref 8–23)
CO2: 27 mmol/L (ref 22–32)
Calcium: 9 mg/dL (ref 8.9–10.3)
Chloride: 107 mmol/L (ref 98–111)
Creatinine, Ser: 0.91 mg/dL (ref 0.61–1.24)
GFR, Estimated: >60 mL/min (ref >60)
Glucose, Bld: 88 mg/dL (ref 70–99)
Potassium: 3.9 mmol/L (ref 3.5–5.1)
Sodium: 140 mmol/L (ref 135–145)

## 2021-11-22 LAB — CBC WITH DIFFERENTIAL/PLATELET
Abs Immature Granulocytes: 0.03 10*3/uL (ref 0.00–0.07)
Basophils Absolute: 0 10*3/uL (ref 0.0–0.1)
Basophils Relative: 0 %
Eosinophils Absolute: 0.2 10*3/uL (ref 0.0–0.5)
Eosinophils Relative: 2 %
HCT: 34.4 % — ABNORMAL LOW (ref 39.0–52.0)
Hemoglobin: 11.2 g/dL — ABNORMAL LOW (ref 13.0–17.0)
Immature Granulocytes: 0 %
Lymphocytes Relative: 19 %
Lymphs Abs: 1.7 10*3/uL (ref 0.7–4.0)
MCH: 30.4 pg (ref 26.0–34.0)
MCHC: 32.6 g/dL (ref 30.0–36.0)
MCV: 93.2 fL (ref 80.0–100.0)
Monocytes Absolute: 0.6 10*3/uL (ref 0.1–1.0)
Monocytes Relative: 7 %
Neutro Abs: 6.4 10*3/uL (ref 1.7–7.7)
Neutrophils Relative %: 72 %
Platelets: 198 10*3/uL (ref 150–400)
RBC: 3.69 MIL/uL — ABNORMAL LOW (ref 4.22–5.81)
RDW: 13 % (ref 11.5–15.5)
WBC: 8.9 10*3/uL (ref 4.0–10.5)
nRBC: 0 % (ref 0.0–0.2)

## 2021-11-22 LAB — PROTIME-INR
INR: 1.2 (ref 0.8–1.2)
Prothrombin Time: 14.8 seconds (ref 11.4–15.2)

## 2021-11-22 MED ORDER — TETANUS-DIPHTH-ACELL PERTUSSIS 5-2.5-18.5 LF-MCG/0.5 IM SUSY
0.5000 mL | PREFILLED_SYRINGE | Freq: Once | INTRAMUSCULAR | Status: AC
  Administered 2021-11-22: 0.5 mL via INTRAMUSCULAR
  Filled 2021-11-22: qty 0.5

## 2021-11-22 NOTE — ED Provider Notes (Signed)
[facility] Provider Note    Event Date/Time   First MD Initiated Contact with Patient 11/22/21 1455     (approximate)   History   Arm Injury   HPI  Louis Gomez is a 81 y.o. male who presents today for evaluation of the left arm bruising.   He reports that he was mowing his lawn and the dinner bowel fell onto his left arm.  He reports that he is on Eliquis.  He has noticed bruising today prompting him to come in for evaluation.  He denies numbness or tingling.  He denies weakness.  He sustained 2 skin tears which he wrapped up.  He is unsure of his last tetanus shot.  Reports that the bell also fell onto his left leg but he has not had any problems here.  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   Aortic stenosis 05/01/2018   CAD (coronary artery disease) 05/01/2018   Sciatica of right side 03/26/2017   Basal cell carcinoma of right eyelid 07/27/2015   Heart murmur, systolic 14/43/1540   ED (erectile dysfunction) 11/29/2012   BPH (benign prostatic hyperplasia) 12/01/2011          Physical Exam   Triage Vital Signs: ED Triage Vitals  Enc Vitals Group     BP 11/22/21 1542 104/62     Pulse Rate 11/22/21 1433 75     Resp 11/22/21 1433 20     Temp 11/22/21 1433 98.7 F (37.1 C)     Temp Source 11/22/21 1433 Oral     SpO2 11/22/21 1433 96 %     Weight 11/22/21 1430 160 lb (72.6 kg)     Height 11/22/21 1430 '5\' 8"'$  (1.727 m)     Head Circumference --      Peak Flow --      Pain Score 11/22/21 1430 3     Pain Loc --      Pain Edu? --      Excl. in [place]? --     Most recent vital signs: Vitals:   11/22/21 1433 11/22/21 1542  BP:  104/62  Pulse: 75 70  Resp: 20 18  Temp: 98.7 F (37.1 C) 98.7 F (37.1 C)  SpO2: 96% 96%    Physical Exam Vitals and nursing note reviewed.  Constitutional:      General: Awake and alert. No acute  distress.    Appearance: Normal appearance. The patient is normal weight.  HENT:     Head: Normocephalic and atraumatic.     Mouth: Mucous membranes are moist.  Eyes:     General: PERRL. Normal EOMs        Right eye: No discharge.        Left eye: No discharge.     Conjunctiva/sclera: Conjunctivae normal.  Cardiovascular:     Rate and Rhythm: Normal rate and regular rhythm.     Pulses: Normal pulses.     Heart sounds: Normal heart sounds Pulmonary:     Effort: Pulmonary effort is normal. No respiratory distress.     Breath sounds: Normal breath sounds.  Abdominal:     Abdomen is soft. There is no abdominal tenderness. No rebound or guarding. No distention. Musculoskeletal:        General: No swelling. Normal range of motion.     Cervical back: Normal range of motion and neck supple.  Left arm:  Ecchymosis and swelling mild swelling extending from elbow to forearm, dorsal aspect only.  Compartment soft and compressible throughout.  2 superficial skin tears noted, no active bleeding.  Normal radial pulse.  Normal intrinsic muscle function of the hand.  Normal strength and sensation throughout arm. Left leg is normal in appearance.  No hematoma, skin injury, ecchymosis, or erythema noted.  Full and normal range of motion of hip, knee, ankle.  Normal distal pulses. No swelling/edema Skin:    General: Skin is warm and dry.     Capillary Refill: Capillary refill takes less than 2 seconds.     Findings: No rash.  Neurological:     Mental Status: The patient is awake and alert.      ED Results / Procedures / Treatments   Labs (all labs ordered are listed, but only abnormal results are displayed) Labs Reviewed  CBC WITH DIFFERENTIAL/PLATELET - Abnormal; Notable for the following components:      Result Value   RBC 3.69 (*)    Hemoglobin 11.2 (*)    HCT 34.4 (*)    All other components within normal limits  PROTIME-INR  BASIC METABOLIC PANEL     EKG     RADIOLOGY I  independently reviewed and interpreted imaging and agree with radiologists findings.     PROCEDURES:  Critical Care performed:   Procedures   MEDICATIONS ORDERED IN ED: Medications  Tdap (BOOSTRIX) injection 0.5 mL (0.5 mLs Intramuscular Given 11/22/21 1540)     IMPRESSION / MDM / ASSESSMENT AND PLAN / ED COURSE  I reviewed the triage vital signs and the nursing notes.   Differential diagnosis includes, but is not limited to, Differential diagnosis includes, but is not limited to, contusion, abrasion, hematoma.  Patient has ecchymosis to the dorsum of his arm, not circumferential, in the setting of "dinner bell" falling on his arm.  His compartments are soft and compressible throughout, he has normal radial pulse, normal sensation throughout his arm, normal grip strength.  There are no signs of actively enlarging hematoma, no evidence of compartment syndrome.  He has normal and full range of motion of his arm, normal strength and sensation.  Labs normal, normal platelets. No easy bleeding elsewhere. There are no obvious abnormalities noted to his leg.  There is no evidence of injury to his leg.  He reports that he does not have any pain to his leg.  No hematoma, abrasion, or contusion noted.  He has full normal range of motion of his hip, knee, ankle, normal distal pulses.  Normal strength and sensation throughout.  No TTP. Exam and history not consistent with Morel-lavallee lesion. His skin tears were bandaged and his arm was wrapped.  We discussed wound care and return precautions.  His tetanus was updated.  Recommended close outpatient follow-up and we discussed return precautions.  Patient understands and agrees with plan.  He was discharged in stable condition.   Patient's presentation is most consistent with acute complicated illness / injury requiring diagnostic workup.     FINAL CLINICAL IMPRESSION(S) / ED DIAGNOSES   Final diagnoses:  Contusion of left elbow, initial encounter   Hematoma     Rx / DC Orders   ED Discharge Orders     None        Note:  This document was prepared using Dragon voice recognition software and may include unintentional dictation errors.   Emeline Gins 11/22/21 1843    Arta Silence, MD 11/22/21 1925

## 2021-11-22 NOTE — Discharge Instructions (Addendum)
Continue to rest, ice, elevate your arm.  Keep the skin tears clean with soap and water.  Your tetanus was updated.  Please return for any new, worsening, or change in symptoms or other concerns including worsening pain, numbness, tingling, weakness, fever, chills, or any other concerns.

## 2021-11-22 NOTE — ED Triage Notes (Addendum)
Pt via POV from home. Pt states that yesterday he was on a lawn mower and the dinner fell onto his L arm and L leg.  Pt has extensive swelling bruising to the L arm. Pt states he also has some bruising to the L thigh. Pt states pain is minimal. Pt able to move arm freely. Pt is on Eliquis. Denies fall off the lawn mower. Pt is A&Ox4 and NAD. Radial pulses present on the R arm. Ambulatory to triage.

## 2022-03-20 ENCOUNTER — Ambulatory Visit (LOCAL_COMMUNITY_HEALTH_CENTER): Payer: Medicare Other

## 2022-03-20 DIAGNOSIS — Z23 Encounter for immunization: Secondary | ICD-10-CM

## 2022-03-20 DIAGNOSIS — Z719 Counseling, unspecified: Secondary | ICD-10-CM

## 2022-03-20 NOTE — Progress Notes (Signed)
  Are you feeling sick today? No   Have you ever received a dose of COVID-19 Vaccine? [employer], [place], [place], [place], Other) Yes  If yes, which vaccine and how many doses?    Pfizer 5 doses  Did you bring the vaccination record card or other documentation?  Yes   Do you have a health condition or are undergoing treatment that makes you moderately or severely immunocompromised? This would include, but not be limited to: cancer, HIV, organ transplant, immunosuppressive therapy/high-dose corticosteroids, or moderate/severe primary immunodeficiency.  No  Have you received COVID-19 vaccine before or during hematopoietic cell transplant (HCT) or CAR-T-cell therapies? No  Have you ever had an allergic reaction to: (This would include a severe allergic reaction or a reaction that caused hives, swelling, or respiratory distress, including wheezing.) A component of a COVID-19 vaccine or a previous dose of COVID-19 vaccine? No   Have you ever had an allergic reaction to another vaccine (other thanCOVID-19 vaccine) or an injectable medication? (This would include a severe allergic reaction or a reaction that caused hives, swelling, or respiratory distress, including wheezing.)   No    Do you have a history of any of the following:  Myocarditis or Pericarditis No  Dermal fillers:  No  Multisystem Inflammatory Syndrome (MIS-C or MIS-A)? No  COVID-19 disease within the past 3 months? no  Vaccinated with monkeypox vaccine in the last 4 weeks? No  Tonny Branch, RN

## 2023-01-15 ENCOUNTER — Other Ambulatory Visit
Admission: RE | Admit: 2023-01-15 | Discharge: 2023-01-15 | Disposition: A | Discharge status: Home / Self Care | Payer: Medicare Other | Source: Ambulatory Visit | Attending: Nurse Practitioner | Admitting: Nurse Practitioner

## 2023-01-15 DIAGNOSIS — R600 Localized edema: Secondary | ICD-10-CM | POA: Insufficient documentation

## 2023-01-15 DIAGNOSIS — I2510 Atherosclerotic heart disease of native coronary artery without angina pectoris: Secondary | ICD-10-CM | POA: Insufficient documentation

## 2023-01-15 LAB — BRAIN NATRIURETIC PEPTIDE: B Natriuretic Peptide: 107.5 pg/mL — ABNORMAL HIGH (ref 0.0–100.0)

## 2023-11-24 ENCOUNTER — Other Ambulatory Visit: Payer: Self-pay

## 2023-11-24 ENCOUNTER — Observation Stay
Admission: EM | Admit: 2023-11-24 | Discharge: 2023-11-26 | Disposition: A | Discharge status: Home / Self Care | Attending: Internal Medicine | Admitting: Internal Medicine

## 2023-11-24 ENCOUNTER — Emergency Department

## 2023-11-24 DIAGNOSIS — E785 Hyperlipidemia, unspecified: Secondary | ICD-10-CM | POA: Insufficient documentation

## 2023-11-24 DIAGNOSIS — R42 Dizziness and giddiness: Secondary | ICD-10-CM | POA: Diagnosis not present

## 2023-11-24 DIAGNOSIS — I2510 Atherosclerotic heart disease of native coronary artery without angina pectoris: Secondary | ICD-10-CM | POA: Diagnosis not present

## 2023-11-24 DIAGNOSIS — R270 Ataxia, unspecified: Principal | ICD-10-CM | POA: Insufficient documentation

## 2023-11-24 DIAGNOSIS — I495 Sick sinus syndrome: Secondary | ICD-10-CM | POA: Insufficient documentation

## 2023-11-24 DIAGNOSIS — J8410 Pulmonary fibrosis, unspecified: Secondary | ICD-10-CM | POA: Diagnosis not present

## 2023-11-24 DIAGNOSIS — I4821 Permanent atrial fibrillation: Secondary | ICD-10-CM | POA: Diagnosis not present

## 2023-11-24 DIAGNOSIS — I10 Essential (primary) hypertension: Secondary | ICD-10-CM | POA: Diagnosis not present

## 2023-11-24 HISTORY — DX: Pulmonary fibrosis, unspecified: J84.10

## 2023-11-24 HISTORY — DX: Essential (primary) hypertension: I10

## 2023-11-24 LAB — TROPONIN I (HIGH SENSITIVITY): Troponin I (High Sensitivity): 10 ng/L (ref <18)

## 2023-11-24 LAB — CBC WITH DIFFERENTIAL/PLATELET
Abs Immature Granulocytes: 0.02 K/uL (ref 0.00–0.07)
Basophils Absolute: 0 K/uL (ref 0.0–0.1)
Basophils Relative: 1 %
Eosinophils Absolute: 0.2 K/uL (ref 0.0–0.5)
Eosinophils Relative: 3 %
HCT: 40.5 % (ref 39.0–52.0)
Hemoglobin: 12.6 g/dL — ABNORMAL LOW (ref 13.0–17.0)
Immature Granulocytes: 0 %
Lymphocytes Relative: 18 %
Lymphs Abs: 1.1 K/uL (ref 0.7–4.0)
MCH: 29.4 pg (ref 26.0–34.0)
MCHC: 31.1 g/dL (ref 30.0–36.0)
MCV: 94.6 fL (ref 80.0–100.0)
Monocytes Absolute: 0.5 K/uL (ref 0.1–1.0)
Monocytes Relative: 8 %
Neutro Abs: 4.3 K/uL (ref 1.7–7.7)
Neutrophils Relative %: 70 %
Platelets: 159 K/uL (ref 150–400)
RBC: 4.28 MIL/uL (ref 4.22–5.81)
RDW: 14 % (ref 11.5–15.5)
WBC: 6.1 K/uL (ref 4.0–10.5)
nRBC: 0 % (ref 0.0–0.2)

## 2023-11-24 LAB — COMPREHENSIVE METABOLIC PANEL WITH GFR
ALT: 16 U/L (ref 0–44)
AST: 21 U/L (ref 15–41)
Albumin: 3.2 g/dL — ABNORMAL LOW (ref 3.5–5.0)
Alkaline Phosphatase: 105 U/L (ref 38–126)
Anion gap: 8 (ref 5–15)
BUN: 16 mg/dL (ref 8–23)
CO2: 30 mmol/L (ref 22–32)
Calcium: 9 mg/dL (ref 8.9–10.3)
Chloride: 101 mmol/L (ref 98–111)
Creatinine, Ser: 0.82 mg/dL (ref 0.61–1.24)
GFR, Estimated: >60 mL/min (ref >60)
Glucose, Bld: 110 mg/dL — ABNORMAL HIGH (ref 70–99)
Potassium: 4.2 mmol/L (ref 3.5–5.1)
Sodium: 139 mmol/L (ref 135–145)
Total Bilirubin: 0.6 mg/dL (ref 0.0–1.2)
Total Protein: 6.6 g/dL (ref 6.5–8.1)

## 2023-11-24 MED ORDER — SENNOSIDES-DOCUSATE SODIUM 8.6-50 MG PO TABS
1.0000 | ORAL_TABLET | Freq: Every evening | ORAL | Status: DC | PRN
Start: 2023-11-24 — End: 2023-11-27

## 2023-11-24 MED ORDER — IOHEXOL 350 MG/ML SOLN
75.0000 mL | Freq: Once | INTRAVENOUS | Status: AC | PRN
  Administered 2023-11-24: 75 mL via INTRAVENOUS

## 2023-11-24 MED ORDER — ATORVASTATIN CALCIUM 20 MG PO TABS
40.0000 mg | ORAL_TABLET | Freq: Every day | ORAL | Status: DC
  Administered 2023-11-25 – 2023-11-26 (×2): 40 mg via ORAL
  Filled 2023-11-24 (×2): qty 2

## 2023-11-24 MED ORDER — ASPIRIN 81 MG PO TBEC
81.0000 mg | DELAYED_RELEASE_TABLET | Freq: Every day | ORAL | Status: DC
  Administered 2023-11-26: 81 mg via ORAL
  Filled 2023-11-24: qty 1

## 2023-11-24 MED ORDER — ADULT MULTIVITAMIN W/MINERALS CH
1.0000 | ORAL_TABLET | Freq: Every day | ORAL | Status: DC
  Administered 2023-11-25 – 2023-11-26 (×2): 1 via ORAL
  Filled 2023-11-24 (×2): qty 1

## 2023-11-24 MED ORDER — APIXABAN 5 MG PO TABS
5.0000 mg | ORAL_TABLET | Freq: Two times a day (BID) | ORAL | Status: DC
  Administered 2023-11-25 – 2023-11-26 (×4): 5 mg via ORAL
  Filled 2023-11-24 (×4): qty 1

## 2023-11-24 MED ORDER — STROKE: EARLY STAGES OF RECOVERY BOOK: Freq: Once | Status: DC

## 2023-11-24 MED ORDER — ACETAMINOPHEN 650 MG RE SUPP: 650.0000 mg | RECTAL | Status: DC | PRN

## 2023-11-24 MED ORDER — ACETAMINOPHEN 160 MG/5ML PO SOLN: 650.0000 mg | ORAL | Status: DC | PRN

## 2023-11-24 MED ORDER — ACETAMINOPHEN 325 MG PO TABS: 650.0000 mg | ORAL_TABLET | ORAL | Status: DC | PRN

## 2023-11-24 NOTE — H&P (Signed)
 History and Physical  Louis Gomez FMW:[id number] DOB: [birth year] DOA: 11/24/2023  PCP: Lenon Layman ORN, MD   Chief Complaint: Loss of balance and disequilibrium  HPI: Louis Gomez is a 83 y.o. male with medical history significant for pulmonary fibrosis, ILD s/p partial pneumonectomy, pAF on Eliquis , HLD, CAD, SSS s/p PPM in 03/2023, severe AS s/p TAVR in 03/2023, HLD, HTN and BPH who presents to the ED for evaluation of disequilibrium.  Patient reports that yesterday morning she woke up and felt unsteady on his feet. He continued to feel like this throughout the day but he did not go out.  This morning, he woke up with similar symptoms, feeling off balance and feeling like he was drunk.  Due to difficulty walking in room concern for possible fall, he called EMS.  He denies any lightheadedness, spinning sensation, numbness, tingling, weakness, vision changes, headache, chest pain, palpitations, fevers, chills, GI or urinary symptoms.  ED Course: Initial vitals show patient afebrile and normotensive. Initial labs significant for unremarkable CBC, CMP and normal troponin. EKG shows AV dual paced rhythm. MRI brain unable to be done due to patient having pacemaker.  Neurology was consulted for evaluation and recommended CTA head and neck that showed no LVO or intracranial abnormality. TRH was consulted for admission.   Review of Systems: Please see HPI for pertinent positives and negatives. A complete 10 system review of systems are otherwise negative.  No past medical history on file. No past surgical history on file. Social History:  reports that he has never smoked. His smokeless tobacco use includes chew. No history on file for alcohol use and drug use.  Allergies  Allergen Reactions   Sulfa Antibiotics     No family history on file.   Prior to Admission medications   Medication Sig Start Date End Date Taking? Authorizing Provider  aspirin  EC 81 MG tablet Take by mouth.     [provider]  atorvastatin  (LIPITOR) 40 MG tablet  10/29/18   [provider]  azelastine (ASTELIN) 0.1 % nasal spray Place into the nose. 06/09/18   [provider]  Multiple Vitamin (MULTIVITAMIN) capsule Take by mouth.    [provider]    Physical Exam: BP 126/78 (BP Location: Left Arm)   Pulse 70   Temp 97.9 F (36.6 C) (Oral)   Resp 20   SpO2 91%  General: Pleasant, weak appearing elderly man laying in bed. No acute distress. HEENT: [place]/AT. Anicteric sclera. PERRLA. EOMI. CV: RRR. Mechanical heart sounds. No LE edema Pulmonary: Normal effort. Anterior lung sounds clear to auscultation bilaterally. Abdominal: Soft, nontender, nondistended. Normal bowel sounds. Extremities: Palpable radial and DP pulses. Normal ROM. Skin: Warm and dry. No obvious rash or lesions. Neuro: A&Ox3. Speech is clear and comprehensible. No facial droop. Normal finger-to-nose testing. Slightly abnormal heel-to-shin testing. Normal grip strength. Strength 4/5 in all extremities. Moves all extremities. Normal sensation to light touch. Psych: Normal mood and affect          Labs on Admission:  Basic Metabolic Panel: Recent Labs  Lab 11/24/23 1148  NA 139  K 4.2  CL 101  CO2 30  GLUCOSE 110*  BUN 16  CREATININE 0.82  CALCIUM  9.0   Liver Function Tests: Recent Labs  Lab 11/24/23 1148  AST 21  ALT 16  ALKPHOS 105  BILITOT 0.6  PROT 6.6  ALBUMIN 3.2*   No results for input(s): LIPASE, AMYLASE in the last 168 hours. No results for input(s):  AMMONIA in the last 168 hours. CBC: Recent Labs  Lab 11/24/23 1148  WBC 6.1  NEUTROABS 4.3  HGB 12.6*  HCT 40.5  MCV 94.6  PLT 159   Cardiac Enzymes: No results for input(s): CKTOTAL, CKMB, CKMBINDEX, TROPONINI in the last 168 hours. BNP (last 3 results) Recent Labs    01/15/23 1356  BNP 107.5*    ProBNP (last 3 results) No results for input(s): PROBNP in the last 8760  hours.  CBG: No results for input(s): GLUCAP in the last 168 hours.  Radiological Exams on Admission: CT ANGIO HEAD NECK W WO CM Result Date: 11/24/2023 CLINICAL DATA:  Ataxia with dizziness for approximately 36 hours. Unsteady gait for 2 days. EXAM: CT ANGIOGRAPHY HEAD AND NECK WITH AND WITHOUT CONTRAST TECHNIQUE: Multidetector CT imaging of the head and neck was performed using the standard protocol during bolus administration of intravenous contrast. Multiplanar CT image reconstructions and MIPs were obtained to evaluate the vascular anatomy. Carotid stenosis measurements (when applicable) are obtained utilizing NASCET criteria, using the distal internal carotid diameter as the denominator. RADIATION DOSE REDUCTION: This exam was performed according to the departmental dose-optimization program which includes automated exposure control, adjustment of the mA and/or kV according to patient size and/or use of iterative reconstruction technique. CONTRAST:  75mL OMNIPAQUE  IOHEXOL  350 MG/ML SOLN COMPARISON:  None Available. FINDINGS: CT HEAD FINDINGS Brain: No acute intracranial hemorrhage. No CT evidence of acute infarct. Nonspecific hypoattenuation in the periventricular and subcortical white matter favored to reflect chronic microvascular ischemic changes. Hypoattenuating focus in the inferior aspect of the left basal ganglia likely reflecting prominent perivascular space versus remote lacunar infarct. Additional small focus of remote infarct in the right corona radiata. No edema, mass effect, or midline shift. The basilar cisterns are patent. Ventricles: The ventricles are normal. Vascular: Atherosclerotic calcifications of the carotid siphons and intracranial vertebral arteries. No hyperdense vessel. Skull: No acute or aggressive finding. Orbits: Orbits are symmetric. Sinuses: Mild mucosal thickening in the visualized paranasal sinuses greatest in the left ethmoid sinus. Other: Mastoid air cells are clear.  CTA NECK FINDINGS Aortic arch: Standard configuration of the aortic arch. Imaged portion shows no evidence of aneurysm or dissection. No significant stenosis of the major arch vessel origins. Pulmonary arteries: As permitted by contrast timing, there are no filling defects in the visualized pulmonary arteries. Subclavian arteries: The subclavian arteries are patent bilaterally. Right carotid system: No evidence of dissection, stenosis (50% or greater), or occlusion. Tortuosity of the proximal common carotid artery. Mild atherosclerosis at the carotid bifurcation. Left carotid system: No evidence of dissection, stenosis (50% or greater), or occlusion. Mild atherosclerosis at the carotid bifurcation. Vertebral arteries: Left vertebral artery is dominant. No evidence of dissection, stenosis (50% or greater), or occlusion. Atherosclerosis at the right vertebral artery origin without stenosis. Mild tortuosity of the left V1 segment. Skeleton: No acute findings. Degenerative changes in the cervical spine. Severe disc space narrowing particularly at C4-5 and C5-6. Other neck: The visualized airway is patent. No cervical lymphadenopathy. Upper chest: Partially visualized lung apices demonstrating areas of peripheral fibrosis/scarring. Left chest wall pacer device partially visualized. Review of the MIP images confirms the above findings CTA HEAD FINDINGS ANTERIOR CIRCULATION: The intracranial internal carotid arteries are patent bilaterally. Mild atherosclerosis of the carotid siphons without high-grade stenosis. MCAs: The middle cerebral arteries are patent bilaterally. ACAs: The anterior cerebral arteries are patent bilaterally. POSTERIOR CIRCULATION: No significant stenosis, proximal occlusion, aneurysm, or vascular malformation. PCAs: The posterior cerebral arteries are patent  bilaterally. Pcomm: Not well visualized. SCAs: The superior cerebellar arteries are patent bilaterally. Basilar artery: Patent AICAs: Patent  PICAs: Patent Vertebral arteries: The intracranial vertebral arteries are patent. Venous sinuses: As permitted by contrast timing, patent. Anatomic variants: None Review of the MIP images confirms the above findings IMPRESSION: No large vessel occlusion. No high-grade stenosis, aneurysm, or dissection of the arteries in the head and neck. No CT evidence of acute intracranial abnormality. Chronic microvascular ischemic changes. 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 Additional prominent perivascular space versus remote lacunar infarct in the left basal ganglia. Electronically Signed   By: Donnice Mania M.D.   On: 11/24/2023 18:21   Assessment/Plan Louis Gomez is a 83 y.o. male with medical history significant for  pulmonary fibrosis, ILD s/p partial pneumonectomy, pAF on Eliquis , HLD, CAD, SSS s/p PPM in 03/2023, severe AS s/p TAVR in 03/2023, HLD, HTN and BPH who presents to the ED for evaluation of disequilibrium and admitted for ataxia.  # Ataxia - Patient presented with 2 days of worsening disequilibrium and difficulty walking - Patient found to be ataxic on admission, slightly abnormal heel-to-shin testing on neuro exam - MRI brain unable to be performed due to lack of availability of device rep to shut down pacemaker this weekend - CTA head and neck with no acute intercranial abnormality or LVO - Neurology consulted, recommends admission for observation, will see in a.m. - Admit to progressive bed for close neuro-monitoring - PT/OT/SLP eval and treat - Fall precautions  # A-fib on Eliquis  # SSS s/p PPM # AS s/p TAVR - EKG on admission shows AV dual paced rhythm - HR stable in 70s - Continue Eliquis  - Telemetry  # CAD # HLD - Continue atorvastatin  and aspirin   # Pulmonary fibrosis/ILD - Chronic and stable  # HTN - BP stable with SBP in the 100s to 120s  DVT prophylaxis: Eliquis     Code Status: Full Code  Consults called: Neurology  Family Communication: Discussed  admission with son at bedside  Severity of Illness: The appropriate patient status for this patient is INPATIENT. Inpatient status is judged to be reasonable and necessary in order to provide the required intensity of service to ensure the patient's safety. The patient's presenting symptoms, physical exam findings, and initial radiographic and laboratory data in the context of their chronic comorbidities is felt to place them at high risk for further clinical deterioration. Furthermore, it is not anticipated that the patient will be medically stable for discharge from the hospital within 2 midnights of admission.   * I certify that at the point of admission it is my clinical judgment that the patient will require inpatient hospital care spanning beyond 2 midnights from the point of admission due to high intensity of service, high risk for further deterioration and high frequency of surveillance required.*  Level of care: Progressive   This record has been created using Conservation officer, historic buildings. Errors have been sought and corrected, but may not always be located. Such creation errors do not reflect on the standard of care.   [name], MD 11/24/2023, 8:32 PM [facility] Pager: [phone] Isaiah 41:10   If 7PM-7AM, please contact night-coverage [email or web address] Password [email or web address]

## 2023-11-24 NOTE — ED Triage Notes (Signed)
 From [facility], C/o dizziness and unsteady gait x 2 days. Denies CP/ SHOB. Does have Pacemaker/ defibrillator. Went to [facility] as a walk in to be seen for the dizziness

## 2023-11-24 NOTE — ED Provider Notes (Signed)
 [facility] Provider Note    Event Date/Time   First MD Initiated Contact with Patient 11/24/23 1156     (approximate)   History   Dizziness   HPI Louis Gomez is a 83 y.o. male with history of paroxysmal A-fib on Eliquis , s/p pacemaker, CAD, pulmonary fibrosis on as needed oxygen presenting today for difficulty walking.  Patient states since waking up yesterday morning he has had difficulty with walking.  Feels slightly off balance when ambulating.  Denies any falls.  Denies room spinning sensation, lightheadedness, passing out, head injury, numbness, weakness, speech changes, vision changes.  No other acute symptoms such as chest pain, shortness of breath, cough, congestion, abdominal pain, nausea, vomiting, dysuria.  Unsure if he has missed any dose of his Eliquis .     Physical Exam   Triage Vital Signs: ED Triage Vitals [11/24/23 1142]  Encounter Vitals Group     BP 101/63     Girls Systolic BP Percentile      Girls Diastolic BP Percentile      Boys Systolic BP Percentile      Boys Diastolic BP Percentile      Pulse Rate 69     Resp 18     Temp 98.9 F (37.2 C)     Temp Source Oral     SpO2 96 %     Weight      Height      Head Circumference      Peak Flow      Pain Score      Pain Loc      Pain Education      Exclude from Growth Chart     Most recent vital signs: Vitals:   11/24/23 1142 11/24/23 1630  BP: 101/63 106/69  Pulse: 69 71  Resp: 18 18  Temp: 98.9 F (37.2 C) 98.8 F (37.1 C)  SpO2: 96% 97%   Physical Exam: I have reviewed the vital signs and nursing notes. General: Awake, alert, no acute distress.  Nontoxic appearing. Head:  Atraumatic, normocephalic.   ENT:  EOM intact, PERRL. Oral mucosa is pink and moist with no lesions.  Ear canals clear bilaterally of cerumen and no signs of infection. Neck: Neck is supple with full range of motion, No meningeal signs. Cardiovascular:  RRR, No murmurs. Peripheral pulses  palpable and equal bilaterally. Respiratory:  Symmetrical chest wall expansion.  No rhonchi, rales, or wheezes.  Good air movement throughout.  No use of accessory muscles.   Musculoskeletal:  No cyanosis or edema. Moving extremities with full ROM Abdomen:  Soft, nontender, nondistended. Neuro:  GCS 15, moving all four extremities, interacting appropriately. Speech clear.  Cranial nerves II through XII intact.  Sensation equal and intact throughout bilateral upper and lower extremities.  5 out of 5 strength throughout bilateral upper and lower extremities.  No dysmetria on finger-to-nose or heel-to-shin testing.  Slightly shuffles feet when walking but no profound ataxia and can walk unassisted at this time. Psych:  Calm, appropriate.   Skin:  Warm, dry, no rash.    ED Results / Procedures / Treatments   Labs (all labs ordered are listed, but only abnormal results are displayed) Labs Reviewed  CBC WITH DIFFERENTIAL/PLATELET - Abnormal; Notable for the following components:      Result Value   Hemoglobin 12.6 (*)    All other components within normal limits  COMPREHENSIVE METABOLIC PANEL WITH GFR - Abnormal; Notable for the following components:  Glucose, Bld 110 (*)    Albumin 3.2 (*)    All other components within normal limits  TROPONIN I (HIGH SENSITIVITY)     EKG My EKG interpretation: Rate of 70, AV dual paced rhythm.  Otherwise normal intervals and normal axis.  No acute ST elevations or depressions   RADIOLOGY Independently interpreted CTA head and neck with no acute pathology.   PROCEDURES:  Critical Care performed: No  Procedures   MEDICATIONS ORDERED IN ED: Medications  iohexol  (OMNIPAQUE ) 350 MG/ML injection 75 mL (75 mLs Intravenous Contrast Given 11/24/23 1707)     IMPRESSION / MDM / ASSESSMENT AND PLAN / ED COURSE  I reviewed the triage vital signs and the nursing notes.                              Differential diagnosis includes, but is not limited  to, CVA, inner ear abnormality, dehydration, electrolyte abnormality  Patient's presentation is most consistent with acute presentation with potential threat to life or bodily function.  Patient is an 83 year old male presenting today for 2 days of dizziness and difficulty ambulating symptoms.  He is outside any window to activate a stroke alert given symptom onset greater than 24 hours ago.  His exam at this time shows only neurological complaint as slight ataxia as he is unbalanced when I ambulate him but does not completely fall.  Otherwise cranial nerves II through XII intact and strength and sensation appear intact throughout.  No dysmetria issues with finger-to-nose or heel-to-shin.  Vital signs otherwise stable.  CBC, CMP, and troponin all unremarkable and EKG consistent with his baseline.  Initial plan was for MRI given symptom onset greater than 36 hours ago, however do not have staff here for him to get MRI with his pacemaker until earliest Monday.  Discussed with neurology and CTA head and neck was ordered.  This was negative.  Given his ongoing ataxia findings, they recommended admission for reevaluation in the morning and potential additional imaging.  Patient is agreeable with this plan will be admitted to hospitalist for further care.  The patient is on the cardiac monitor to evaluate for evidence of arrhythmia and/or significant heart rate changes. Clinical Course as of 11/24/23 1848  Sat Nov 24, 2023  1546 Spoke with MRI who states patient is unable to get an MRI until Monday given his pacemaker.  He is also not able to get an MRI at [facility] until Monday as well as they need a cardiologist in house. Will order CTA head/neck and consult neurology for further recommendations regarding workup [DW]  1601 Neurology - get CTA head/neck, would expect to see something on there. Don't need MRI. If negative, may need admission for repeat neurology evaluation in the morning [DW]    Clinical  Course User Index [DW] Malvina Alm DASEN, MD     FINAL CLINICAL IMPRESSION(S) / ED DIAGNOSES   Final diagnoses:  Ataxia     Rx / DC Orders   ED Discharge Orders     None        Note:  This document was prepared using Dragon voice recognition software and may include unintentional dictation errors.   Malvina Alm DASEN, MD 11/24/23 [phone]

## 2023-11-24 NOTE — ED Triage Notes (Signed)
 First RN note:  Pt, sent by [facility], c/o dizziness and unsteady gait x1 day.  Vitals are stable.  Pt reports symptoms have slightly improved today.  Denies GI/GU/Respiratory complaints.

## 2023-11-25 ENCOUNTER — Encounter: Payer: Self-pay | Admitting: Student

## 2023-11-25 DIAGNOSIS — I951 Orthostatic hypotension: Secondary | ICD-10-CM | POA: Diagnosis not present

## 2023-11-25 DIAGNOSIS — G459 Transient cerebral ischemic attack, unspecified: Secondary | ICD-10-CM

## 2023-11-25 DIAGNOSIS — J8410 Pulmonary fibrosis, unspecified: Secondary | ICD-10-CM

## 2023-11-25 DIAGNOSIS — R42 Dizziness and giddiness: Secondary | ICD-10-CM | POA: Diagnosis not present

## 2023-11-25 DIAGNOSIS — I4821 Permanent atrial fibrillation: Secondary | ICD-10-CM | POA: Diagnosis not present

## 2023-11-25 DIAGNOSIS — R270 Ataxia, unspecified: Secondary | ICD-10-CM | POA: Diagnosis not present

## 2023-11-25 DIAGNOSIS — Z515 Encounter for palliative care: Secondary | ICD-10-CM | POA: Diagnosis not present

## 2023-11-25 LAB — CBC
HCT: 38.7 % — ABNORMAL LOW (ref 39.0–52.0)
Hemoglobin: 12.4 g/dL — ABNORMAL LOW (ref 13.0–17.0)
MCH: 30 pg (ref 26.0–34.0)
MCHC: 32 g/dL (ref 30.0–36.0)
MCV: 93.5 fL (ref 80.0–100.0)
Platelets: 165 K/uL (ref 150–400)
RBC: 4.14 MIL/uL — ABNORMAL LOW (ref 4.22–5.81)
RDW: 13.9 % (ref 11.5–15.5)
WBC: 5 K/uL (ref 4.0–10.5)
nRBC: 0 % (ref 0.0–0.2)

## 2023-11-25 LAB — BASIC METABOLIC PANEL WITH GFR
Anion gap: 9 (ref 5–15)
BUN: 17 mg/dL (ref 8–23)
CO2: 27 mmol/L (ref 22–32)
Calcium: 8.4 mg/dL — ABNORMAL LOW (ref 8.9–10.3)
Chloride: 100 mmol/L (ref 98–111)
Creatinine, Ser: 0.71 mg/dL (ref 0.61–1.24)
GFR, Estimated: >60 mL/min (ref >60)
Glucose, Bld: 84 mg/dL (ref 70–99)
Potassium: 4 mmol/L (ref 3.5–5.1)
Sodium: 136 mmol/L (ref 135–145)

## 2023-11-25 MED ORDER — ORAL CARE MOUTH RINSE: 15.0000 mL | OROMUCOSAL | Status: DC | PRN

## 2023-11-25 NOTE — Plan of Care (Signed)

## 2023-11-25 NOTE — Evaluation (Signed)
 Physical Therapy Evaluation Patient Details Name: Louis Gomez MRN: [id number] DOB: [birth year] Today's Date: 11/25/2023  History of Present Illness  83 y/o male presented to ED on 11/24/23 for dizziness and unsteady gait x 1 day. CT negative. PMH: pulmonary fibrosis, pAF, CAD, SSS s/p PPM in 2024, severe AS s/p TAVR 2024, HTN  Clinical Impression  Patient admitted with the above. PTA, patient lives alone and reports he was independent with no AD. Patient presents with weakness, impaired balance, dizziness, and decreased activity tolerance. No dizziness present at rest. Patient with reports of 4-5/10 dizziness with ambulation utilizing RW and CGA. Unsteady without AD support and required minA to steady. Educated patient on use of RW for support during mobility to prevent fall, patient verbalized understanding. Encouraged patient to have family/friend stay with him for first few days at discharge for safety. Patient will benefit from skilled PT services during acute stay to address listed deficits. Patient will benefit from ongoing therapy at discharge to maximize functional independence and safety.         If plan is discharge home, recommend the following: A little help with walking and/or transfers;A little help with bathing/dressing/bathroom;Assistance with cooking/housework;Assist for transportation;Help with stairs or ramp for entrance   Can travel by private vehicle        Equipment Recommendations Rolling Jamila Slatten (2 wheels)  Recommendations for Other Services       Functional Status Assessment Patient has had a recent decline in their functional status and demonstrates the ability to make significant improvements in function in a reasonable and predictable amount of time.     Precautions / Restrictions Precautions Precautions: Fall Recall of Precautions/Restrictions: Intact Restrictions Weight Bearing Restrictions Per Provider Order: No      Mobility  Bed Mobility Overal bed  mobility: Needs Assistance Bed Mobility: Supine to Sit, Sit to Supine     Supine to sit: Supervision Sit to supine: Supervision        Transfers Overall transfer level: Needs assistance Equipment used: None, Rolling Gaye Scorza (2 wheels) Transfers: Sit to/from Stand Sit to Stand: Min assist, Contact guard assist           General transfer comment: stood at bedside with no AD to use urinal with minA to steady. On 2nd stand, utilized RW    Ambulation/Gait Ambulation/Gait assistance: Contact guard assist Gait Distance (Feet): 200 Feet Assistive device: Rolling Kaianna Dolezal (2 wheels) Gait Pattern/deviations: Step-through pattern, Decreased stride length Gait velocity: decreased     General Gait Details: CGA for safety. No drifting noted but reports 4-5/10 dizziness with mobility.  Stairs            Wheelchair Mobility     Tilt Bed    Modified Rankin (Stroke Patients Only)       Balance Overall balance assessment: Needs assistance Sitting-balance support: No upper extremity supported, Feet supported Sitting balance-Leahy Scale: Good     Standing balance support: No upper extremity supported, During functional activity Standing balance-Leahy Scale: Poor                               Pertinent Vitals/Pain Pain Assessment Pain Assessment: No/denies pain    Home Living Family/patient expects to be discharged to:: Private residence Living Arrangements: Alone Available Help at Discharge: Family Type of Home: House Home Access: Stairs to enter Entrance Stairs-Rails: Left Entrance Stairs-Number of Steps: 5   Home Layout: One level Home Equipment: None  Prior Function Prior Level of Function : Independent/Modified Independent;Driving             Mobility Comments: amb with no AD       Extremity/Trunk Assessment   Upper Extremity Assessment Upper Extremity Assessment: Defer to OT evaluation    Lower Extremity Assessment Lower  Extremity Assessment: Generalized weakness (no coordination deficits noted)    Cervical / Trunk Assessment Cervical / Trunk Assessment: Kyphotic  Communication   Communication Communication: Impaired Factors Affecting Communication: Hearing impaired    Cognition Arousal: Alert Behavior During Therapy: WFL for tasks assessed/performed   PT - Cognitive impairments: No apparent impairments                         Following commands: Intact       Cueing       General Comments      Exercises     Assessment/Plan    PT Assessment Patient needs continued PT services  PT Problem List Decreased strength;Decreased balance;Decreased activity tolerance;Decreased mobility;Decreased knowledge of use of DME;Decreased safety awareness;Decreased knowledge of precautions;Cardiopulmonary status limiting activity       PT Treatment Interventions Functional mobility training;Therapeutic activities;Therapeutic exercise;Balance training;Stair training;Gait training;DME instruction;Neuromuscular re-education;Patient/family education    PT Goals (Current goals can be found in the Care Plan section)  Acute Rehab PT Goals Patient Stated Goal: to not be dizzy PT Goal Formulation: With patient Time For Goal Achievement: 12/09/23 Potential to Achieve Goals: Good    Frequency Min 3X/week     Co-evaluation               AM-PAC PT 6 Clicks Mobility  Outcome Measure Help needed turning from your back to your side while in a flat bed without using bedrails?: A Little Help needed moving from lying on your back to sitting on the side of a flat bed without using bedrails?: A Little Help needed moving to and from a bed to a chair (including a wheelchair)?: A Little Help needed standing up from a chair using your arms (e.g., wheelchair or bedside chair)?: A Little Help needed to walk in hospital room?: A Little Help needed climbing 3-5 steps with a railing? : A Little 6 Click  Score: 18    End of Session Equipment Utilized During Treatment: Gait belt Activity Tolerance: Patient tolerated treatment well Patient left: in bed;with bed alarm set;with call bell/phone within reach Nurse Communication: Mobility status PT Visit Diagnosis: Unsteadiness on feet (R26.81);Muscle weakness (generalized) (M62.81);Difficulty in walking, not elsewhere classified (R26.2);Dizziness and giddiness (R42)    Time: 9148-9091 PT Time Calculation (min) (ACUTE ONLY): 17 min   Charges:   PT Evaluation $PT Eval Moderate Complexity: 1 Mod   PT General Charges $$ ACUTE PT VISIT: 1 Visit         Maryanne Finder, PT, DPT Physical Therapist - [facility]  [facility]   Jemiah Ellenburg A Cass Edinger 11/25/2023, 9:19 AM

## 2023-11-25 NOTE — Plan of Care (Signed)

## 2023-11-25 NOTE — Consult Note (Signed)
 Consultation Note Date: 11/25/2023   Patient Name: Louis Gomez  DOB: Dec 06, 1940  MRN: [id number]  Age / Sex: 83 y.o., male  PCP: Lenon Layman ORN, MD Referring Physician: Maree Hue, MD  Reason for Consultation: Establishing goals of care   HPI/Brief Hospital Course: 83 y.o. male  with past medical history of pulmonary fibrosis, interstitial lung disease s/p partial pneumonectomy, paroxysmal A-fib on Eliquis , hyperlipidemia, CAD, SSS s/p PPM in 2024, severe AAS status post TAVR in 2024, hypertension, and BPH admitted from home on 11/24/2023 with dizziness and ataxia.  Reportedly has experienced 2 days of worsening dizziness and difficulty walking, denies fall In ED labs unremarkable, CTA head and neck negative, unable to obtain MRI due to PPM in place  Pending neurology consult  Palliative medicine was consulted for assisting with goals of care conversation  Subjective:  Extensive chart review has been completed prior to meeting patient including labs, vital signs, imaging, progress notes, orders, and available advanced directive documents from current and previous encounters.  Visited with Louis Gomez at his bedside.  He is awake, alert, oriented x 4 and able to engage in goals of care conversation.  No family or visitors at bedside during time of visit.  Introduced myself as a Publishing rights manager as a member of the palliative care team. Explained palliative medicine is specialized medical care for people living with serious illness. It focuses on providing relief from the symptoms and stress of a serious illness. The goal is to improve quality of life for both the patient and the family.   Louis Gomez provides a brief life review.  He is widowed, has 2 children a son and a daughter, his son lives next-door and his daughter lives out of town.  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  Since selling he has remained very active.  Has a  small farm and works around his home every day.  He lives independently and does not require any assistance with day-to-day activities.  He enjoys a social lifestyle.  Reviewed his emergency contact list, he requests for Niels to be removed as they are no longer acquainted and to add his daughter Amy.  We discussed patient's current illness and what it means in the larger context of patient's on-going co-morbidities. Natural disease trajectory and expectations at EOL were discussed.   Attempted to elicit goals of care.  He shares he has not completed an advance directive or appointed anyone's HCPOA.  He shares he would want both his son and daughter to be surrogate Management consultant.  We discussed CODE STATUS and the difference between full code and DO NOT RESUSCITATE.  Louis Gomez, Louis Gomez shares he has a good understanding of this and at this time wishes to remain full code.  He is hopeful to be able to return home.  Was able to work with physical therapy this morning, reports improvement in his dizziness, felt as though he did well with assistance from walker.  He denies pain or acute discomfort, reports no recent change in his appetite or unexplained weight loss and reports sleeping well at night.  I discussed importance of continued conversations with family/support persons and all members of their medical team regarding overall plan of care and treatment options ensuring decisions are in alignment with patients goals of care.  All questions/concerns addressed. Emotional support provided to patient/family/support persons. PMT will step away from daily visits as goals and plan remain clear, please reengage if needs or  concerns arise or in the event of decline or decompensation.  Objective: Primary Diagnoses: Present on Admission: **None**   Physical Exam Constitutional:      General: He is not in acute distress.    Appearance: He is not ill-appearing.  Pulmonary:     Effort: Pulmonary effort is  normal. No respiratory distress.  Skin:    General: Skin is warm and dry.  Neurological:     Mental Status: He is alert and oriented to person, place, and time.  Psychiatric:        Mood and Affect: Mood normal.        Behavior: Behavior normal.        Thought Content: Thought content normal.     Vital Signs: BP 107/74 (BP Location: Left Arm)   Pulse 68   Temp 97.7 F (36.5 C) (Oral)   Resp 19   SpO2 96%  Pain Scale: 0-10   Pain Score: 0-No pain  IO: Intake/output summary:  Intake/Output Summary (Last 24 hours) at 11/25/2023 1538 Last data filed at 11/25/2023 1000 Gross per 24 hour  Intake 600 ml  Output 975 ml  Net -375 ml    LBM:   Baseline Weight:   Most recent weight:         Palliative Assessment/Data:100%   Assessment and Plan  SUMMARY OF RECOMMENDATIONS   Full code/full code Wishes to appoint son and daughter as surrogate decision maker  Palliative Prophylaxis:   Bowel Regimen, Delirium Protocol and Frequent Pain Assessment  Thank you for this consult and allowing Palliative Medicine to participate in the care of Louis Gomez. Palliative medicine will continue to follow and assist as needed.   Time Total: 75 minutes  Time spent includes: Detailed review of medical records (labs, imaging, vital signs), medically appropriate exam (mental status, respiratory, cardiac, skin), discussed with treatment team, counseling and educating patient, family and staff, documenting clinical information, medication management and coordination of care.   Signed by: Waddell Lesches, DNP, AGNP-C Palliative Medicine    Please contact Palliative Medicine Team phone at [phone] for questions and concerns.  For individual provider: See Tracey

## 2023-11-25 NOTE — Progress Notes (Signed)
 1      PROGRESS NOTE    Louis Gomez  FMW:[id number] DOB: 1940-11-14 DOA: 11/24/2023 PCP: Lenon Layman ORN, MD    Brief Narrative:   83 y.o. male with medical history significant for  pulmonary fibrosis, ILD s/p partial pneumonectomy, pAF on Eliquis , HLD, CAD, SSS s/p PPM in 03/2023, severe AS s/p TAVR in 03/2023, HLD, HTN and BPH who presents to the ED for evaluation of disequilibrium and admitted for ataxia.  7/6: Neurology consult   Assessment & Plan:   Principal Problem:   Ataxia Active Problems:   Disequilibrium   # Ataxia - Patient presented with 2 days of worsening disequilibrium and difficulty walking - Patient found to be ataxic on admission, slightly abnormal heel-to-shin testing on neuro exam - MRI brain unable to be performed due to lack of availability of device rep to shut down pacemaker this weekend - CTA head and neck with no acute intercranial abnormality or LVO - Neurology consult pending - PT/OT/SLP eval and treat - Fall precautions   # A-fib on Eliquis  # SSS s/p PPM # AS s/p TAVR - EKG on admission shows AV dual paced rhythm - HR stable in 70s - Continue Eliquis  - Telemetry   # CAD # HLD - Continue atorvastatin  and aspirin    # Pulmonary fibrosis/ILD - Chronic and stable   # HTN - BP stable with SBP in the 100s to 120s   DVT prophylaxis: Eliquis   apixaban  (ELIQUIS ) tablet 5 mg     Code Status: Full code Family Communication: None at bedside Disposition Plan: Possible discharge in next 1 to 2 days depending on clinical condition and neurology workup   Consultants:  Neurology    Subjective:  He is feeling somewhat better.  Ambulated with PT  Objective: Vitals:   11/25/23 0045 11/25/23 0448 11/25/23 0838 11/25/23 1223  BP: (!) 88/62 107/69 117/78 107/74  Pulse: 71 69 70 68  Resp: 18 18 16 19   Temp: 97.8 F (36.6 C) 97.9 F (36.6 C) (!) 97.3 F (36.3 C) 97.7 F (36.5 C)  TempSrc:   Axillary Oral  SpO2: 95% 96% 96% 96%     Intake/Output Summary (Last 24 hours) at 11/25/2023 1259 Last data filed at 11/25/2023 1000 Gross per 24 hour  Intake 600 ml  Output 975 ml  Net -375 ml   There were no vitals filed for this visit.  Examination:  General exam: Appears calm and comfortable  Respiratory system: Clear to auscultation. Respiratory effort normal. Cardiovascular system: S1 & S2 heard, RRR. No murmurs. No pedal edema. Gastrointestinal system: Abdomen is soft, benign Central nervous system: Alert and oriented. No focal neurological deficits. Extremities: Symmetric 5 x 5 power. Skin: No rashes, lesions or ulcers Psychiatry: Judgement and insight appear normal. Mood & affect appropriate.     Data Reviewed: I have personally reviewed following labs and imaging studies  CBC: Recent Labs  Lab 11/24/23 1148 11/25/23 0254  WBC 6.1 5.0  NEUTROABS 4.3  --   HGB 12.6* 12.4*  HCT 40.5 38.7*  MCV 94.6 93.5  PLT 159 165   Basic Metabolic Panel: Recent Labs  Lab 11/24/23 1148 11/25/23 0254  NA 139 136  K 4.2 4.0  CL 101 100  CO2 30 27  GLUCOSE 110* 84  BUN 16 17  CREATININE 0.82 0.71  CALCIUM  9.0 8.4*   GFR: CrCl cannot be calculated (Unknown ideal weight.). Liver Function Tests: Recent Labs  Lab 11/24/23 1148  AST 21  ALT 16  ALKPHOS 105  BILITOT 0.6  PROT 6.6  ALBUMIN 3.2*     Radiology Studies: CT ANGIO HEAD NECK W WO CM Result Date: 11/24/2023 CLINICAL DATA:  Ataxia with dizziness for approximately 36 hours. Unsteady gait for 2 days. EXAM: CT ANGIOGRAPHY HEAD AND NECK WITH AND WITHOUT CONTRAST TECHNIQUE: Multidetector CT imaging of the head and neck was performed using the standard protocol during bolus administration of intravenous contrast. Multiplanar CT image reconstructions and MIPs were obtained to evaluate the vascular anatomy. Carotid stenosis measurements (when applicable) are obtained utilizing NASCET criteria, using the distal internal carotid diameter as the denominator.  RADIATION DOSE REDUCTION: This exam was performed according to the departmental dose-optimization program which includes automated exposure control, adjustment of the mA and/or kV according to patient size and/or use of iterative reconstruction technique. CONTRAST:  75mL OMNIPAQUE  IOHEXOL  350 MG/ML SOLN COMPARISON:  None Available. FINDINGS: CT HEAD FINDINGS Brain: No acute intracranial hemorrhage. No CT evidence of acute infarct. Nonspecific hypoattenuation in the periventricular and subcortical white matter favored to reflect chronic microvascular ischemic changes. Hypoattenuating focus in the inferior aspect of the left basal ganglia likely reflecting prominent perivascular space versus remote lacunar infarct. Additional small focus of remote infarct in the right corona radiata. No edema, mass effect, or midline shift. The basilar cisterns are patent. Ventricles: The ventricles are normal. Vascular: Atherosclerotic calcifications of the carotid siphons and intracranial vertebral arteries. No hyperdense vessel. Skull: No acute or aggressive finding. Orbits: Orbits are symmetric. Sinuses: Mild mucosal thickening in the visualized paranasal sinuses greatest in the left ethmoid sinus. Other: Mastoid air cells are clear. CTA NECK FINDINGS Aortic arch: Standard configuration of the aortic arch. Imaged portion shows no evidence of aneurysm or dissection. No significant stenosis of the major arch vessel origins. Pulmonary arteries: As permitted by contrast timing, there are no filling defects in the visualized pulmonary arteries. Subclavian arteries: The subclavian arteries are patent bilaterally. Right carotid system: No evidence of dissection, stenosis (50% or greater), or occlusion. Tortuosity of the proximal common carotid artery. Mild atherosclerosis at the carotid bifurcation. Left carotid system: No evidence of dissection, stenosis (50% or greater), or occlusion. Mild atherosclerosis at the carotid bifurcation.  Vertebral arteries: Left vertebral artery is dominant. No evidence of dissection, stenosis (50% or greater), or occlusion. Atherosclerosis at the right vertebral artery origin without stenosis. Mild tortuosity of the left V1 segment. Skeleton: No acute findings. Degenerative changes in the cervical spine. Severe disc space narrowing particularly at C4-5 and C5-6. Other neck: The visualized airway is patent. No cervical lymphadenopathy. Upper chest: Partially visualized lung apices demonstrating areas of peripheral fibrosis/scarring. Left chest wall pacer device partially visualized. Review of the MIP images confirms the above findings CTA HEAD FINDINGS ANTERIOR CIRCULATION: The intracranial internal carotid arteries are patent bilaterally. Mild atherosclerosis of the carotid siphons without high-grade stenosis. MCAs: The middle cerebral arteries are patent bilaterally. ACAs: The anterior cerebral arteries are patent bilaterally. POSTERIOR CIRCULATION: No significant stenosis, proximal occlusion, aneurysm, or vascular malformation. PCAs: The posterior cerebral arteries are patent bilaterally. Pcomm: Not well visualized. SCAs: The superior cerebellar arteries are patent bilaterally. Basilar artery: Patent AICAs: Patent PICAs: Patent Vertebral arteries: The intracranial vertebral arteries are patent. Venous sinuses: As permitted by contrast timing, patent. Anatomic variants: None Review of the MIP images confirms the above findings IMPRESSION: No large vessel occlusion. No high-grade stenosis, aneurysm, or dissection of the arteries in the head and neck. No CT evidence of acute intracranial abnormality. Chronic microvascular ischemic changes. 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 Additional prominent perivascular space versus remote lacunar infarct in the left basal ganglia. Electronically Signed   By: Donnice Mania M.D.   On: 11/24/2023 18:21        Scheduled Meds:  apixaban   5 mg Oral BID   aspirin  EC   81 mg Oral Daily   atorvastatin   40 mg Oral Daily   multivitamin with minerals  1 tablet Oral Daily   Continuous Infusions:   LOS: 1 day    Time spent: 35 minutes    Cresencio Fairly, MD [facility] Pager [phone]  If 7PM-7AM, please contact night-coverage [email or web address]  11/25/2023, 12:59 PM

## 2023-11-25 NOTE — Progress Notes (Signed)
 OT Cancellation Note  Patient Details Name: Louis Gomez MRN: [id number] DOB: May 14, 1941   Cancelled Treatment:    Reason Eval/Treat Not Completed: Fatigue/lethargy limiting ability to participate. Consult received, chart reviewed. Pt sleeping soundly upon attempt. Will re-attempt at later date/time as appropriate.   Alichia Alridge R., MPH, MS, OTR/L ascom [phone] 11/25/23, 4:00 PM

## 2023-11-25 NOTE — Plan of Care (Signed)
 Pt A&Ox 4, follows commands, alert.   Pt able to ambulate around room with standby assist. Pt denies any dizziness or light headiness.     Problem: Education: Goal: Knowledge of disease or condition will improve Outcome: Progressing Goal: Knowledge of secondary prevention will improve (MUST DOCUMENT ALL) Outcome: Progressing Goal: Knowledge of patient specific risk factors will improve (DELETE if not current risk factor) Outcome: Progressing   Problem: Ischemic Stroke/TIA Tissue Perfusion: Goal: Complications of ischemic stroke/TIA will be minimized Outcome: Progressing   Problem: Coping: Goal: Will verbalize positive feelings about self Outcome: Progressing Goal: Will identify appropriate support needs Outcome: Progressing   Problem: Health Behavior/Discharge Planning: Goal: Ability to manage health-related needs will improve Outcome: Progressing Goal: Goals will be collaboratively established with patient/family Outcome: Progressing   Problem: Self-Care: Goal: Ability to participate in self-care as condition permits will improve Outcome: Progressing Goal: Verbalization of feelings and concerns over difficulty with self-care will improve Outcome: Progressing Goal: Ability to communicate needs accurately will improve Outcome: Progressing   Problem: Nutrition: Goal: Risk of aspiration will decrease Outcome: Progressing Goal: Dietary intake will improve Outcome: Progressing   Problem: Education: Goal: Knowledge of General Education information will improve Description: Including pain rating scale, medication(s)/side effects and non-pharmacologic comfort measures Outcome: Progressing   Problem: Health Behavior/Discharge Planning: Goal: Ability to manage health-related needs will improve Outcome: Progressing   Problem: Clinical Measurements: Goal: Ability to maintain clinical measurements within normal limits will improve Outcome: Progressing Goal: Will remain free  from infection Outcome: Progressing Goal: Diagnostic test results will improve Outcome: Progressing Goal: Respiratory complications will improve Outcome: Progressing Goal: Cardiovascular complication will be avoided Outcome: Progressing   Problem: Activity: Goal: Risk for activity intolerance will decrease Outcome: Progressing   Problem: Nutrition: Goal: Adequate nutrition will be maintained Outcome: Progressing   Problem: Coping: Goal: Level of anxiety will decrease Outcome: Progressing   Problem: Elimination: Goal: Will not experience complications related to bowel motility Outcome: Progressing Goal: Will not experience complications related to urinary retention Outcome: Progressing   Problem: Pain Managment: Goal: General experience of comfort will improve and/or be controlled Outcome: Progressing   Problem: Safety: Goal: Ability to remain free from injury will improve Outcome: Progressing   Problem: Skin Integrity: Goal: Risk for impaired skin integrity will decrease Outcome: Progressing

## 2023-11-25 NOTE — Progress Notes (Addendum)
 SLP Cancellation Note  Patient Details Name: Louis Gomez MRN: [id number] DOB: [birth year]   Cancelled treatment:       Reason Eval/Treat Not Completed: SLP screened, no needs identified, will sign off (chart reviewed; consulted MD and met w/ pt in room.)   Pt is a 83 y.o. male with medical history significant for pulmonary fibrosis, ILD s/p partial pneumonectomy, pAF on Eliquis , HLD, CAD, SSS s/p PPM in 03/2023, severe AS s/p TAVR in 03/2023, HLD, HTN and BPH who presents to the ED with 2 days of worsening disequilibrium and difficulty walking. Patient found to be ataxic on admission.   Pt denied any difficulty swallowing and is currently on a regular diet; tolerates swallowing pills w/ water per NSG.  Pt conversed in general conversation and made wants/needs known w/out expressive/receptive deficits noted; pt denied any speech-language deficits. Speech intelligible. No further skilled ST services indicated as pt appears at his functional communication baseline. Pt agreed. NSG to reconsult if any change in status while admitted.     Comer Portugal, MS, CCC-SLP Speech Language Pathologist Rehab Services; [facility] [phone] (ascom) Tyrone Balash 11/25/2023, 9:05 AM

## 2023-11-26 ENCOUNTER — Observation Stay (HOSPITAL_BASED_OUTPATIENT_CLINIC_OR_DEPARTMENT_OTHER)
Admit: 2023-11-26 | Discharge: 2023-11-26 | Disposition: A | Discharge status: Home / Self Care | Attending: Neurology | Admitting: Neurology

## 2023-11-26 DIAGNOSIS — R42 Dizziness and giddiness: Secondary | ICD-10-CM | POA: Diagnosis not present

## 2023-11-26 DIAGNOSIS — I951 Orthostatic hypotension: Secondary | ICD-10-CM | POA: Diagnosis not present

## 2023-11-26 DIAGNOSIS — I4821 Permanent atrial fibrillation: Secondary | ICD-10-CM | POA: Diagnosis not present

## 2023-11-26 DIAGNOSIS — J8410 Pulmonary fibrosis, unspecified: Secondary | ICD-10-CM | POA: Diagnosis not present

## 2023-11-26 LAB — CBC
HCT: 40.4 % (ref 39.0–52.0)
Hemoglobin: 13 g/dL (ref 13.0–17.0)
MCH: 29.8 pg (ref 26.0–34.0)
MCHC: 32.2 g/dL (ref 30.0–36.0)
MCV: 92.7 fL (ref 80.0–100.0)
Platelets: 168 K/uL (ref 150–400)
RBC: 4.36 MIL/uL (ref 4.22–5.81)
RDW: 14.1 % (ref 11.5–15.5)
WBC: 6.2 K/uL (ref 4.0–10.5)
nRBC: 0 % (ref 0.0–0.2)

## 2023-11-26 LAB — BASIC METABOLIC PANEL WITH GFR
Anion gap: 8 (ref 5–15)
BUN: 18 mg/dL (ref 8–23)
CO2: 28 mmol/L (ref 22–32)
Calcium: 8.7 mg/dL — ABNORMAL LOW (ref 8.9–10.3)
Chloride: 102 mmol/L (ref 98–111)
Creatinine, Ser: 0.78 mg/dL (ref 0.61–1.24)
GFR, Estimated: >60 mL/min (ref >60)
Glucose, Bld: 88 mg/dL (ref 70–99)
Potassium: 3.9 mmol/L (ref 3.5–5.1)
Sodium: 138 mmol/L (ref 135–145)

## 2023-11-26 MED ORDER — APIXABAN 5 MG PO TABS: 5.0000 mg | ORAL_TABLET | Freq: Two times a day (BID) | ORAL | 0 refills | Status: AC

## 2023-11-26 NOTE — TOC Initial Note (Signed)
 Transition of Care [facility]) - Initial/Assessment Note    Patient Details  Name: Louis Gomez MRN: [id number] Date of Birth: November 18, 1940  Transition of Care [facility]) CM/SW Contact:    Elouise LULLA Capri, RN 11/26/2023, 12:42 PM  Clinical Narrative:                  CM to patient's room regarding case management assessment. Patient states lives alone. Patient states previous home health experience with [facility]. CM call to Larraine, [facility] regarding home health referral. [facility] is following for discharge. CM secure message to Thom, [employer] regarding rolling walker order. CM alert to Dr. Maree regarding DME order for signature and [facility] has accepted patient.   Expected Discharge Plan: Home w Home Health Services Barriers to Discharge: Continued Medical Work up   Patient Goals and CMS Choice    Home health PT/OT Rolling Walker     Expected Discharge Plan and Services    Home health   Living arrangements for the past 2 months: Single Family Home     Prior Living Arrangements/Services Living arrangements for the past 2 months: Single Family Home Lives with:: Self Patient language and need for interpreter reviewed:: No        Need for Family Participation in Patient Care: Yes (Comment) Care giver support system in place?: Yes (comment)   Criminal Activity/Legal Involvement Pertinent to Current Situation/Hospitalization: No - Comment as needed  Activities of Daily Living   ADL Screening (condition at time of admission) Independently performs ADLs?: Yes (appropriate for developmental age) Is the patient deaf or have difficulty hearing?: No Does the patient have difficulty seeing, even when wearing glasses/contacts?: No Does the patient have difficulty concentrating, remembering, or making decisions?: No  Permission Sought/Granted      Share Information with NAME: Louis Gomez/Louis Gomez     Permission granted to share info w Relationship:  Son/Daughter  Permission granted to share info w Contact Information: yes  Emotional Assessment Appearance:: Appears stated age Attitude/Demeanor/Rapport: Engaged Affect (typically observed): Calm Orientation: : Oriented to Self, Oriented to  Time, Oriented to Place Alcohol / Substance Use: Not Applicable    Admission diagnosis:  Ataxia [R27.0] Patient Active Problem List   Diagnosis Date Noted   Permanent atrial fibrillation (HCC) 11/25/2023   Pulmonary fibrosis (HCC) 11/25/2023   Ataxia 11/24/2023   Disequilibrium 11/24/2023   Generalized osteoarthritis of hand 12/18/2018   Elevated rheumatoid factor 06/19/2018   Aortic stenosis 05/01/2018   CAD (coronary artery disease) 05/01/2018   Sciatica of right side 03/26/2017   Basal cell carcinoma of right eyelid 07/27/2015   Heart murmur, systolic 03/22/2015   ED (erectile dysfunction) 11/29/2012   BPH (benign prostatic hyperplasia) 12/01/2011   PCP:  Lenon Layman ORN, MD Pharmacy:   [facility] [phone] [address] [place], [place] - [address] AT [facility] [address] [place] [address] [place] [address] Phone: [phone] Fax: [phone]     Social Drivers of Health (SDOH) Social History: SDOH Screenings   Food Insecurity: No Food Insecurity (11/25/2023)  Housing: Low Risk  (11/25/2023)  Transportation Needs: No Transportation Needs (11/25/2023)  Utilities: Not At Risk (11/25/2023)  Depression (PHQ2-9): Low Risk  (04/08/2019)  Financial Resource Strain: Low Risk  (02/07/2023)   Received from [facility]  Social Connections: Unknown (11/25/2023)  Tobacco Use: High Risk (11/25/2023)   SDOH Interventions:     Readmission Risk  Interventions     No data to display

## 2023-11-26 NOTE — Progress Notes (Signed)
*  PRELIMINARY RESULTS* Echocardiogram 2D Echocardiogram has been performed.  Louis Gomez 11/26/2023, 4:04 PM

## 2023-11-26 NOTE — Care Management Obs Status (Signed)
 MEDICARE OBSERVATION STATUS NOTIFICATION   Patient Details  Name: Louis Gomez MRN: [id number] Date of Birth: 04-01-1941   Medicare Observation Status Notification Given:  Yes    Elouise LULLA Capri, RN 11/26/2023, 3:26 PM

## 2023-11-26 NOTE — Progress Notes (Signed)
 Physical Therapy Treatment Patient Details Name: Louis Gomez MRN: [id number] DOB: 1940-09-07 Today's Date: 11/26/2023   History of Present Illness 83 y/o male presented to ED on 11/24/23 for dizziness and unsteady gait x 1 day. CT negative. PMH: pulmonary fibrosis, pAF, CAD, SSS s/p PPM in 2024, severe AS s/p TAVR 2024, HTN.    PT Comments  Pt sitting on EOB upon PT arrival (NT present getting orthostatic vitals).  Once finished with NT obtaining vitals, pt used bathroom and then pt ambulated around nursing loop with RW.  Then focused on sit to stand repetition for strengthening and then standing balance (eyes open and then eyes closed).  No loss of balance noted during sessions activities.  Pt reporting minimal dizziness during session's activities.  Will continue to focus on strengthening, balance, and progressive functional mobility during hospitalization.    If plan is discharge home, recommend the following: A little help with walking and/or transfers;A little help with bathing/dressing/bathroom;Assistance with cooking/housework;Assist for transportation;Help with stairs or ramp for entrance   Can travel by private vehicle      Yes  Equipment Recommendations  Rolling walker (2 wheels)    Recommendations for Other Services       Precautions / Restrictions Precautions Precautions: Fall Recall of Precautions/Restrictions: Intact Restrictions Weight Bearing Restrictions Per Provider Order: No     Mobility  Bed Mobility Overal bed mobility: Needs Assistance Bed Mobility: Sit to Supine       Sit to supine: Supervision        Transfers Overall transfer level: Needs assistance Equipment used: Rolling walker (2 wheels), None Transfers: Sit to/from Stand Sit to Stand: Supervision           General transfer comment: x2 trials sit to stand with B UE support on bed; x4 trials sit to stand with B UE on thighs    Ambulation/Gait Ambulation/Gait assistance: Contact guard  assist Gait Distance (Feet): 200 Feet Assistive device: Rolling walker (2 wheels) Gait Pattern/deviations: Step-through pattern Gait velocity: decreased     General Gait Details: steady ambulation; flexed shoulders and head   Stairs             Wheelchair Mobility     Tilt Bed    Modified Rankin (Stroke Patients Only)       Balance Overall balance assessment: Needs assistance Sitting-balance support: No upper extremity supported, Feet supported Sitting balance-Leahy Scale: Normal Sitting balance - Comments: steady reaching outside BOS   Standing balance support: Bilateral upper extremity supported, During functional activity, Reliant on assistive device for balance Standing balance-Leahy Scale: Good Standing balance comment: steady ambulating with RW use; pt holding onto bar in bathroom for balance when toileting in standing         Rhomberg - Eyes Opened: 30 seconds (no sway noted) Rhomberg - Eyes Closed: 30 seconds (mild sway noted but no LOB; pt reporting feeling weird in general in head but unable to describe (symptoms resolved fairly quickly once finished))                Communication Communication Communication: Impaired Factors Affecting Communication: Hearing impaired  Cognition Arousal: Alert Behavior During Therapy: WFL for tasks assessed/performed   PT - Cognitive impairments: No apparent impairments                         Following commands: Intact      Cueing Cueing Techniques: Verbal cues  Exercises      General  Comments  Pt agreeable to PT session.      Pertinent Vitals/Pain Pain Assessment Pain Assessment: No/denies pain    Home Living                          Prior Function            PT Goals (current goals can now be found in the care plan section) Acute Rehab PT Goals Patient Stated Goal: to not be dizzy PT Goal Formulation: With patient Time For Goal Achievement: 12/09/23 Potential to  Achieve Goals: Good Progress towards PT goals: Progressing toward goals    Frequency    Min 3X/week      PT Plan      Co-evaluation              AM-PAC PT 6 Clicks Mobility   Outcome Measure  Help needed turning from your back to your side while in a flat bed without using bedrails?: None Help needed moving from lying on your back to sitting on the side of a flat bed without using bedrails?: None Help needed moving to and from a bed to a chair (including a wheelchair)?: A Little Help needed standing up from a chair using your arms (e.g., wheelchair or bedside chair)?: A Little Help needed to walk in hospital room?: A Little Help needed climbing 3-5 steps with a railing? : A Little 6 Click Score: 20    End of Session Equipment Utilized During Treatment: Gait belt Activity Tolerance: Patient tolerated treatment well Patient left: in bed;with call bell/phone within reach;with bed alarm set Nurse Communication: Mobility status;Precautions PT Visit Diagnosis: Unsteadiness on feet (R26.81);Muscle weakness (generalized) (M62.81);Difficulty in walking, not elsewhere classified (R26.2);Dizziness and giddiness (R42)     Time: 8376-8353 PT Time Calculation (min) (ACUTE ONLY): 23 min  Charges:    $Gait Training: 8-22 mins $Therapeutic Activity: 8-22 mins PT General Charges $$ ACUTE PT VISIT: 1 Visit                     Damien Caulk, PT 11/26/23, 5:40 PM

## 2023-11-26 NOTE — Evaluation (Signed)
 Occupational Therapy Evaluation Patient Details Name: Louis Gomez MRN: [id number] DOB: 04-28-1941 Today's Date: 11/26/2023   History of Present Illness   83 y/o male presented to ED on 11/24/23 for dizziness and unsteady gait x 1 day. CT negative. PMH: pulmonary fibrosis, pAF, CAD, SSS s/p PPM in 2024, severe AS s/p TAVR 2024, HTN     Clinical Impressions Pt was seen for OT evaluation this date. PTA, pt resides at home alone and is IND without use of AD. He drives, goes to church, etc. Pt presents to acute OT demonstrating impaired ADL performance and functional mobility 2/2 mild weakness/unsteadiness from dizziness. Pt is found seated in recliner, BP monitored and noted to be 82/60 sitting, 100/68 with initial stand and 110/75 after 3 min stand. Pt endorses 1-2/10 dizziness. Pt currently requires supervision for STS from recliner and SBA for in room ambulation, all aspects of toileting and standing oral care at sink. He ambulated additional 200 feet using RW with CGA/SBA and no LOB or unsteadiness noted.  Pt would benefit from skilled OT services to address noted impairments and functional limitations to maximize safety and independence while minimizing falls risk and caregiver burden. Recommend HHOT on DC to ensure safety with ADL/IADL management on return home.      If plan is discharge home, recommend the following:   A little help with walking and/or transfers;A little help with bathing/dressing/bathroom;Help with stairs or ramp for entrance;Assist for transportation;Assistance with cooking/housework     Functional Status Assessment   Patient has had a recent decline in their functional status and demonstrates the ability to make significant improvements in function in a reasonable and predictable amount of time.     Equipment Recommendations   Other (comment) (RW)     Recommendations for Other Services         Precautions/Restrictions   Precautions Precautions:  Fall Recall of Precautions/Restrictions: Intact Restrictions Weight Bearing Restrictions Per Provider Order: No     Mobility Bed Mobility               General bed mobility comments: NT up in chair pre/post session    Transfers Overall transfer level: Needs assistance Equipment used: Rolling walker (2 wheels) Transfers: Sit to/from Stand Sit to Stand: Supervision           General transfer comment: supervision for STS and SBA for mobility using RW within the room to bathroom and back and 1 bout around nursing station      Balance Overall balance assessment: Needs assistance Sitting-balance support: No upper extremity supported, Feet supported Sitting balance-Leahy Scale: Good     Standing balance support: No upper extremity supported, During functional activity Standing balance-Leahy Scale: Good Standing balance comment: no LOB, use of RW                           ADL either performed or assessed with clinical judgement   ADL Overall ADL's : Needs assistance/impaired     Grooming: Wash/dry face;Oral care;Standing;Supervision/safety                   Toilet Transfer: Supervision/safety;Rolling walker (2 wheels)   Toileting- Clothing Manipulation and Hygiene: Supervision/safety;Sitting/lateral lean       Functional mobility during ADLs: Supervision/safety;Rolling walker (2 wheels)       Vision         Perception         Praxis  Pertinent Vitals/Pain Pain Assessment Pain Assessment: No/denies pain     Extremity/Trunk Assessment Upper Extremity Assessment Upper Extremity Assessment: Overall WFL for tasks assessed   Lower Extremity Assessment Lower Extremity Assessment: Generalized weakness   Cervical / Trunk Assessment Cervical / Trunk Assessment: Kyphotic   Communication Communication Communication: Impaired Factors Affecting Communication: Hearing impaired   Cognition Arousal: Alert Behavior During  Therapy: WFL for tasks assessed/performed                                 Following commands: Intact       Cueing  General Comments      2/10 dizziness mentioned, but no LOB or unsteadiness during ambulation this date   Exercises Other Exercises Other Exercises: Edu on role of OT in acute setting.   Shoulder Instructions      Home Living Family/patient expects to be discharged to:: Private residence Living Arrangements: Alone Available Help at Discharge: Family Type of Home: House Home Access: Stairs to enter Secretary/administrator of Steps: 5 Entrance Stairs-Rails: Left Home Layout: One level     Bathroom Shower/Tub: Runner, broadcasting/film/video: None          Prior Functioning/Environment Prior Level of Function : Independent/Modified Independent;Driving             Mobility Comments: amb with no AD      OT Problem List: Impaired balance (sitting and/or standing)   OT Treatment/Interventions: Self-care/ADL training;Therapeutic exercise;Therapeutic activities;Patient/family education;Balance training;DME and/or AE instruction      OT Goals(Current goals can be found in the care plan section)   Acute Rehab OT Goals Patient Stated Goal: return home OT Goal Formulation: With patient Time For Goal Achievement: 12/10/23 Potential to Achieve Goals: Good ADL Goals Pt Will Perform Lower Body Bathing: with modified independence;sitting/lateral leans;sit to/from stand Pt Will Perform Lower Body Dressing: with modified independence;sit to/from stand;sitting/lateral leans Pt Will Transfer to Toilet: with modified independence;ambulating;regular height toilet Pt Will Perform Toileting - Clothing Manipulation and hygiene: with modified independence;sit to/from stand;sitting/lateral leans   OT Frequency:  Min 2X/week    Co-evaluation              AM-PAC OT 6 Clicks Daily Activity     Outcome Measure Help from another person  eating meals?: None Help from another person taking care of personal grooming?: None Help from another person toileting, which includes using toliet, bedpan, or urinal?: A Little Help from another person bathing (including washing, rinsing, drying)?: A Little Help from another person to put on and taking off regular upper body clothing?: None Help from another person to put on and taking off regular lower body clothing?: A Little 6 Click Score: 21   End of Session Equipment Utilized During Treatment: Rolling walker (2 wheels);Gait belt Nurse Communication: Mobility status  Activity Tolerance: Patient tolerated treatment well Patient left: in chair;with call bell/phone within reach;with chair alarm set  OT Visit Diagnosis: Other abnormalities of gait and mobility (R26.89)                Time: 8993-8958 OT Time Calculation (min): 35 min Charges:  OT General Charges $OT Visit: 1 Visit OT Evaluation $OT Eval Moderate Complexity: 1 Mod OT Treatments $Self Care/Home Management : 8-22 mins Ravenne Wayment, OTR/L  11/26/23, 12:18 PM  Debraann Livingstone E Harlynn Kimbell 11/26/2023, 12:15 PM

## 2023-11-26 NOTE — Care Management CC44 (Signed)
 Condition Code 44 Documentation Completed  Patient Details  Name: DALLIS CZAJA MRN: [id number] Date of Birth: 1940-10-17   Condition Code 44 given:  Yes Patient signature on Condition Code 44 notice:  Yes Documentation of 2 MD's agreement:  Yes Code 44 added to claim:  Yes    Elouise LULLA Capri, RN 11/26/2023, 3:26 PM

## 2023-11-26 NOTE — Consult Note (Signed)
 NEUROLOGY CONSULT NOTE   Date of service: November 26, 2023 Patient Name: Louis Gomez MRN:  [id number] DOB:  1940/12/10 Chief Complaint: ataxia Requesting Provider: Maree Hue, MD  History of Present Illness  Louis Gomez is a 83 y.o. male with hx of atrial fibrillation on eliquis , pulmonary fibrosis, ILD s/p partial pneumonectomy, HL, CAD, SSS s/p PM 2024, severe AS s/p TAVR 2024 who presented to ED for evaluation of imbalance and was admitted for ataxia. Patient reports that he frequently feels unsteady when he wakes up but that Thursday this acutely worsened and he was unable to leave the house 2/2 concern for falls. The unsteadiness feels worse in the morning and is worst when he stands up from a seated position. On ED evaluation patient was reported to have mild HTS ataxia but this is not evident on my examination. He is unable to walk without assistance to steady but gait it not frankly ataxic. He reports that he had a prior TBI with subsequent vertigo room/spinning but that he does not have any room spinning dizziness now. He is unable to get MRI brain 2/2 PM. CT/CTA showed no acute process.   ROS   Comprehensive ROS performed and pertinent positives documented in HPI   Past History   Past Medical History:  Diagnosis Date   HTN (hypertension)    Pulmonary fibrosis (HCC)     History reviewed. No pertinent surgical history.  Family History: No family history on file.  Social History  reports that he has never smoked. His smokeless tobacco use includes chew. No history on file for alcohol use and drug use.  Allergies  Allergen Reactions   Sulfa Antibiotics     Medications   Current Facility-Administered Medications:    acetaminophen  (TYLENOL ) tablet 650 mg, 650 mg, Oral, Q4H PRN **OR** acetaminophen  (TYLENOL ) 160 MG/5ML solution 650 mg, 650 mg, Per Tube, Q4H PRN **OR** acetaminophen  (TYLENOL ) suppository 650 mg, 650 mg, Rectal, Q4H PRN, Louis Gomez, Louis HERO, MD    apixaban  (ELIQUIS ) tablet 5 mg, 5 mg, Oral, BID, Louis Gomez, [name], MD, 5 mg at 11/25/23 2150   aspirin  EC tablet 81 mg, 81 mg, Oral, Daily, Louis Gomez, Louis HERO, MD   atorvastatin  (LIPITOR) tablet 40 mg, 40 mg, Oral, Daily, Louis Gomez Louis HERO, MD, 40 mg at 11/25/23 9081   multivitamin with minerals tablet 1 tablet, 1 tablet, Oral, Daily, Louis Gomez Louis HERO, MD, 1 tablet at 11/25/23 9081   Oral care mouth rinse, 15 mL, Mouth Rinse, PRN, Louis America, NP   senna-docusate (Senokot-S) tablet 1 tablet, 1 tablet, Oral, QHS PRN, Louis Gomez Louis HERO, MD  Vitals   Vitals:   11/25/23 1642 11/25/23 2005 11/26/23 0025 11/26/23 0454  BP: 107/63 (!) 90/53 111/65 123/61  Pulse: 70 69 70 70  Resp: 18     Temp: 98.2 F (36.8 C) (!) 97.4 F (36.3 C) (!) 97.4 F (36.3 C) (!) 97.3 F (36.3 C)  TempSrc:      SpO2: 97% 96% 95% 93%    There is no height or weight on file to calculate BMI.   Physical Exam    Gen: patient lying in bed, NAD CV: extremities appear well-perfused Resp: normal WOB  Neurologic exam MS: alert, oriented x4, follows commands Speech: no dysarthria, no aphasia CN: PERRL, VFF, EOMI, sensation intact, face symmetric, hearing somewhat impaired to voice Motor: 5/5 strength throughout Sensory: SILT Reflexes: 2+ symm with toes down bilat Coordination: FNF intact bilat Gait: able to stand but unsteady with broad-based  stance, able to walk with assistance with imbalance but no frank ataxia   Labs/Imaging/Neurodiagnostic studies   CBC:  Recent Labs  Lab 12-16-2023 1148 11/25/23 0254 11/26/23 0431  WBC 6.1 5.0 6.2  NEUTROABS 4.3  --   --   HGB 12.6* 12.4* 13.0  HCT 40.5 38.7* 40.4  MCV 94.6 93.5 92.7  PLT 159 165 168   Basic Metabolic Panel:  Lab Results  Component Value Date   NA 138 11/26/2023   K 3.9 11/26/2023   CO2 28 11/26/2023   GLUCOSE 88 11/26/2023   BUN 18 11/26/2023   CREATININE 0.78 11/26/2023   CALCIUM  8.7 (L) 11/26/2023   GFRNONAA >60  11/26/2023   Lipid Panel: No results found for: LDLCALC HgbA1c: No results found for: HGBA1C Urine Drug Screen: No results found for: LABOPIA, COCAINSCRNUR, LABBENZ, AMPHETMU, THCU, LABBARB  Alcohol Level No results found for: [facility] INR  Lab Results  Component Value Date   INR 1.2 11/22/2021   APTT No results found for: APTT AED levels: No results found for: PHENYTOIN, ZONISAMIDE, LAMOTRIGINE, LEVETIRACETA   CT angio Head and Neck with contrast(Personally reviewed): No large vessel occlusion.   No high-grade stenosis, aneurysm, or dissection of the arteries in the head and neck.   No CT evidence of acute intracranial abnormality.   Chronic microvascular ischemic changes. 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 Additional prominent perivascular space versus remote lacunar infarct in the left basal ganglia.  ASSESSMENT   Louis Gomez is a 83 y.o. male with hx of atrial fibrillation on eliquis , pulmonary fibrosis, ILD s/p partial pneumonectomy, HL, CAD, SSS s/p PM 2024, severe AS s/p TAVR 2024 who presented to ED for evaluation of imbalance. It is reasonable to proceed with TIA workup given his sx were worse yesterday and could have represented transient ischemia but I suspect his residual sx are related to chronic orthostatic hypotension (he has reported this to cardiology since 2024) and deconditioning.  RECOMMENDATIONS   - Given no focal deficits and no stroke on 24 hr head CT, goal normotension, avoid hypotension - Unable to obtain MRI brain 2/2 PM at [facility] over holiday weekend - Orthostatic vitals - TTE - Check A1c and LDL + add statin per guidelines - Continue home eliquis  - q4 hr neuro checks - STAT head CT for any change in neuro exam - Tele - PT/OT - Stroke education - Amb referral to neurology upon discharge   Neurology will f/u on TTE and orthostatics and provide further  guidance  ______________________________________________________________________    Signed, Elida CHRISTELLA Ross, MD [facility]

## 2023-11-27 LAB — ECHOCARDIOGRAM COMPLETE
AR max vel: 2.22 cm2
AV Area VTI: 2.54 cm2
AV Area mean vel: 2.05 cm2
AV Mean grad: 6.3 mmHg
AV Peak grad: 10.7 mmHg
Ao pk vel: 1.64 m/s
Area-P 1/2: 1.89 cm2
MV VTI: 2.88 cm2
S' Lateral: 2.3 cm

## 2023-11-28 NOTE — Discharge Summary (Signed)
 Physician Discharge Summary   Patient: Louis Gomez MRN: [id number] DOB: April 25, 1941  Admit date:     11/24/2023  Discharge date: 11/26/2023  Discharge Physician: Cresencio Fairly   PCP: Lenon Layman ORN, MD   Recommendations at discharge:   Follow-up with outpatient providers as requested  Discharge Diagnoses: Principal Problem:   Ataxia Active Problems:   Disequilibrium   Permanent atrial fibrillation (HCC)   Pulmonary fibrosis [facility])  Hospital Course: Assessment and Plan:  83 y.o. male with medical history significant for  pulmonary fibrosis, ILD s/p partial pneumonectomy, pAF on Eliquis , HLD, CAD, SSS s/p PPM in 03/2023, severe AS s/p TAVR in 03/2023, HLD, HTN and BPH who presents to the ED for evaluation of disequilibrium and admitted for ataxia.   7/6: Neurology consult   # Ataxia/imbalance - Patient presented with 2 days of worsening disequilibrium and difficulty walking - Patient found to be ataxic on admission, slightly abnormal heel-to-shin testing on neuro exam - MRI brain unable to be performed due to lack of availability of device rep to shut down pacemaker this weekend - CTA head and neck with no acute intercranial abnormality or LVO - Neurology seen and recommended to complete TIA workup.  Echo was within normal limit.  No PFO.  Symptoms were thought to be possibly due to chronic orthostatic hypotension and or deconditioning-patient has been following with cardiology since 2024 for same. - PT/OT/SLP eval done.  Recommended home health services which was set up by TOC  # A-fib on Eliquis  # SSS s/p PPM # AS s/p TAVR - EKG on admission shows AV dual paced rhythm - HR stable in 70s - Continue Eliquis    # CAD # HLD - Continue atorvastatin  and aspirin    # Pulmonary fibrosis/ILD - Chronic and stable   # HTN - BP stable            Consultants: Neurology Disposition: Home health Diet recommendation:  Discharge Diet Orders (From admission, onward)      Start     Ordered   11/26/23 0000  Diet - low sodium heart healthy        11/26/23 1730           Carb modified diet DISCHARGE MEDICATION: Allergies as of 11/26/2023       Reactions   Sulfa Antibiotics         Medication List     STOP taking these medications    aspirin  EC 81 MG tablet       TAKE these medications    apixaban  5 MG Tabs tablet Commonly known as: ELIQUIS  Take 1 tablet (5 mg total) by mouth 2 (two) times daily.   atorvastatin  40 MG tablet Commonly known as: LIPITOR   azelastine 0.1 % nasal spray Commonly known as: ASTELIN Place into the nose.   multivitamin capsule Take by mouth.        Follow-up Information     Lenon Layman ORN, MD. Schedule an appointment as soon as possible for a visit in 1 week(s).   Specialty: Internal Medicine Why: hospital follow up, [facility] Discharge F/UP Contact information: [address] [facility] [place] [address] [phone]         [facility] Follow up.   Specialty: Home Health Services Why: 07/07---Per Larraine, agency will follow for home health PT/OT services Contact information: [address] [place] [address] [phone]         [facility] Follow up.   Why: 07/07--Per Thom, will will process for rolling walker for bedside delivery. Contact information: [address] [address] [phone]         Maree Jannett POUR, MD. Schedule an appointment as soon as possible for a visit in 2 week(s).   Specialty: Neurology Why: [facility] Discharge F/UP Contact information: [address] [facility] [place] [address] [phone]         Florencio Kava D, MD. Schedule an appointment as soon as possible for a visit in 2 week(s).   Specialties: Cardiology, Internal Medicine Why: [facility] Discharge F/UP Contact information: [address] [place] [address] [phone]                Discharge Exam: There were no vitals filed for this visit. General exam: Appears calm and comfortable  Respiratory system: Clear to auscultation. Respiratory effort normal. Cardiovascular system: S1 & S2 heard, RRR. No murmurs. No pedal edema. Gastrointestinal system: Abdomen is soft, benign Central nervous system: Alert and oriented. No focal neurological deficits. Extremities: Symmetric 5 x 5 power. Skin: No rashes, lesions or ulcers Psychiatry: Judgement and insight appear normal. Mood & affect appropriate.  Condition at discharge: good  The results of significant diagnostics from this hospitalization (including imaging, microbiology, ancillary and laboratory) are listed below for reference.   Imaging Studies: ECHOCARDIOGRAM COMPLETE Result Date: 11/27/2023    ECHOCARDIOGRAM REPORT   Patient Name:   Louis Gomez Date of Exam: 11/26/2023 Medical Rec #:  [id number]       Height:       68.0 in Accession #:    [id number]      Weight:       160.0 lb Date of Birth:  [birth year]       BSA:          1.859 m Patient Age:    82 years        BP:           109/65 mmHg Patient Gender: M               HR:           68 bpm. Exam Location:  [facility] Procedure: 2D Echo, Cardiac Doppler, Color Doppler and Saline Contrast Bubble            Study (Both Spectral and Color Flow Doppler were utilized during            procedure). Indications:     Stroke I63.9  History:         Patient has no prior history of Echocardiogram examinations.                  Risk Factors:Hypertension. Pulmonary fibrosis.                  Aortic Valve: 26 mm Sapien prosthetic, stented (TAVR) valve is                  present in the aortic position. Procedure Date: 03/2023.  Sonographer:     Christopher Furnace Referring Phys:  [id number] ELIDA HERO STACK Diagnosing Phys: Lonni Hanson MD IMPRESSIONS  1. Left ventricular ejection fraction, by estimation, is 55 to 60%. The left ventricle has  normal function. The left ventricle has no regional wall motion abnormalities. There is moderate left ventricular hypertrophy. Left ventricular diastolic parameters are consistent with Grade I diastolic dysfunction (impaired relaxation).  2. Right ventricular systolic function is mildly reduced. The right ventricular size is normal.  3. The mitral valve is degenerative. Mild mitral valve regurgitation. No evidence of mitral stenosis.  4. The aortic valve has been repaired/replaced. Aortic valve regurgitation is not visualized. No aortic stenosis is present. There is a 26 mm Sapien prosthetic (TAVR) valve present in the aortic position. Procedure Date: 03/2023.  5. Agitated saline contrast bubble study was negative, with no evidence of any interatrial shunt. FINDINGS  Left Ventricle: Left ventricular ejection fraction, by estimation, is 55 to 60%. The left ventricle has normal function. The left ventricle has no regional wall motion abnormalities. The left ventricular internal cavity size was normal in size. There is  moderate left ventricular hypertrophy. Left ventricular diastolic parameters are consistent with Grade I diastolic dysfunction (impaired relaxation). Right Ventricle: The right ventricular size is normal. No increase in right ventricular wall thickness. Right ventricular systolic function is mildly reduced. Left Atrium: Left atrial size was normal in size. Right Atrium: Right atrial size was normal in size. Pericardium: The pericardium was not well visualized. Mitral Valve: The mitral valve is degenerative in appearance. There is mild thickening of the mitral valve leaflet(s). Mild mitral valve regurgitation. No evidence of mitral valve stenosis. MV peak gradient, 4.2 mmHg. The mean mitral valve gradient is 1.0 mmHg. Tricuspid Valve: The tricuspid valve is normal in structure. Tricuspid valve regurgitation is mild. Aortic Valve: The aortic valve has been repaired/replaced. Aortic valve regurgitation is  not visualized. No aortic stenosis is present. Aortic valve mean gradient measures 6.3 mmHg. Aortic valve peak gradient measures 10.7 mmHg. Aortic valve area, by VTI  measures 2.54 cm. There is a 26 mm Sapien prosthetic, stented (TAVR) valve present in the aortic position. Procedure Date: 03/2023. Pulmonic Valve: The pulmonic valve was normal in structure. Pulmonic valve regurgitation is mild. No evidence of pulmonic stenosis. Aorta: The aortic root is normal in size and structure. IAS/Shunts: The interatrial septum was not well visualized. Agitated saline contrast was given intravenously to evaluate for intracardiac shunting. Agitated saline contrast bubble study was negative, with no evidence of any interatrial shunt.  LEFT VENTRICLE PLAX 2D LVIDd:         3.30 cm   Diastology LVIDs:         2.30 cm   LV e' medial:    4.79 cm/s LV PW:         1.30 cm   LV E/e' medial:  11.3 LV IVS:        1.45 cm   LV e' lateral:   4.68 cm/s LVOT diam:     2.10 cm   LV E/e' lateral: 11.5 LV SV:         71 LV SV Index:   38 LVOT Area:     3.46 cm  RIGHT VENTRICLE RV Basal diam:  2.70 cm RV Mid diam:    2.90 cm RV S prime:     11.30 cm/s LEFT ATRIUM             Index        RIGHT ATRIUM          Index LA diam:        3.40 cm 1.83 cm/m   RA Area:     9.32 cm LA Vol (A2C):   23.8 ml 12.80 ml/m  RA Volume:   17.10 ml 9.20 ml/m LA Vol (A4C):   49.7 ml 26.74 ml/m LA Biplane Vol: 35.6 ml 19.15  ml/m  AORTIC VALVE AV Area (Vmax):    2.22 cm AV Area (Vmean):   2.05 cm AV Area (VTI):     2.54 cm AV Vmax:           163.67 cm/s AV Vmean:          116.333 cm/s AV VTI:            0.280 m AV Peak Grad:      10.7 mmHg AV Mean Grad:      6.3 mmHg LVOT Vmax:         105.00 cm/s LVOT Vmean:        69.000 cm/s LVOT VTI:          0.205 m LVOT/AV VTI ratio: 0.73  AORTA Ao Root diam: 3.50 cm MITRAL VALVE               TRICUSPID VALVE MV Area (PHT): 1.89 cm    TR Peak grad:   23.4 mmHg MV Area VTI:   2.88 cm    TR Vmax:        242.00 cm/s MV  Peak grad:  4.2 mmHg MV Mean grad:  1.0 mmHg    SHUNTS MV Vmax:       1.02 m/s    Systemic VTI:  0.20 m MV Vmean:      54.3 cm/s   Systemic Diam: 2.10 cm MV Decel Time: 401 msec MV E velocity: 54.00 cm/s MV A velocity: 92.20 cm/s MV E/A ratio:  0.59 Lonni End MD Electronically signed by Lonni Hanson MD Signature Date/Time: 11/27/2023/11:07:59 AM    Final    CT ANGIO HEAD NECK W WO CM Result Date: 11/24/2023 CLINICAL DATA:  Ataxia with dizziness for approximately 36 hours. Unsteady gait for 2 days. EXAM: CT ANGIOGRAPHY HEAD AND NECK WITH AND WITHOUT CONTRAST TECHNIQUE: Multidetector CT imaging of the head and neck was performed using the standard protocol during bolus administration of intravenous contrast. Multiplanar CT image reconstructions and MIPs were obtained to evaluate the vascular anatomy. Carotid stenosis measurements (when applicable) are obtained utilizing NASCET criteria, using the distal internal carotid diameter as the denominator. RADIATION DOSE REDUCTION: This exam was performed according to the departmental dose-optimization program which includes automated exposure control, adjustment of the mA and/or kV according to patient size and/or use of iterative reconstruction technique. CONTRAST:  75mL OMNIPAQUE  IOHEXOL  350 MG/ML SOLN COMPARISON:  None Available. FINDINGS: CT HEAD FINDINGS Brain: No acute intracranial hemorrhage. No CT evidence of acute infarct. Nonspecific hypoattenuation in the periventricular and subcortical white matter favored to reflect chronic microvascular ischemic changes. Hypoattenuating focus in the inferior aspect of the left basal ganglia likely reflecting prominent perivascular space versus remote lacunar infarct. Additional small focus of remote infarct in the right corona radiata. No edema, mass effect, or midline shift. The basilar cisterns are patent. Ventricles: The ventricles are normal. Vascular: Atherosclerotic calcifications of the carotid siphons and  intracranial vertebral arteries. No hyperdense vessel. Skull: No acute or aggressive finding. Orbits: Orbits are symmetric. Sinuses: Mild mucosal thickening in the visualized paranasal sinuses greatest in the left ethmoid sinus. Other: Mastoid air cells are clear. CTA NECK FINDINGS Aortic arch: Standard configuration of the aortic arch. Imaged portion shows no evidence of aneurysm or dissection. No significant stenosis of the major arch vessel origins. Pulmonary arteries: As permitted by contrast timing, there are no filling defects in the visualized pulmonary arteries. Subclavian arteries: The subclavian arteries are patent bilaterally. Right carotid system: No evidence of  dissection, stenosis (50% or greater), or occlusion. Tortuosity of the proximal common carotid artery. Mild atherosclerosis at the carotid bifurcation. Left carotid system: No evidence of dissection, stenosis (50% or greater), or occlusion. Mild atherosclerosis at the carotid bifurcation. Vertebral arteries: Left vertebral artery is dominant. No evidence of dissection, stenosis (50% or greater), or occlusion. Atherosclerosis at the right vertebral artery origin without stenosis. Mild tortuosity of the left V1 segment. Skeleton: No acute findings. Degenerative changes in the cervical spine. Severe disc space narrowing particularly at C4-5 and C5-6. Other neck: The visualized airway is patent. No cervical lymphadenopathy. Upper chest: Partially visualized lung apices demonstrating areas of peripheral fibrosis/scarring. Left chest wall pacer device partially visualized. Review of the MIP images confirms the above findings CTA HEAD FINDINGS ANTERIOR CIRCULATION: The intracranial internal carotid arteries are patent bilaterally. Mild atherosclerosis of the carotid siphons without high-grade stenosis. MCAs: The middle cerebral arteries are patent bilaterally. ACAs: The anterior cerebral arteries are patent bilaterally. POSTERIOR CIRCULATION: No  significant stenosis, proximal occlusion, aneurysm, or vascular malformation. PCAs: The posterior cerebral arteries are patent bilaterally. Pcomm: Not well visualized. SCAs: The superior cerebellar arteries are patent bilaterally. Basilar artery: Patent AICAs: Patent PICAs: Patent Vertebral arteries: The intracranial vertebral arteries are patent. Venous sinuses: As permitted by contrast timing, patent. Anatomic variants: None Review of the MIP images confirms the above findings IMPRESSION: No large vessel occlusion. No high-grade stenosis, aneurysm, or dissection of the arteries in the head and neck. No CT evidence of acute intracranial abnormality. Chronic microvascular ischemic changes. 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 Additional prominent perivascular space versus remote lacunar infarct in the left basal ganglia. Electronically Signed   By: Donnice Mania M.D.   On: 11/24/2023 18:21    Microbiology: No results found for this or any previous visit.  Labs: CBC: Recent Labs  Lab 11/24/23 1148 11/25/23 0254 11/26/23 0431  WBC 6.1 5.0 6.2  NEUTROABS 4.3  --   --   HGB 12.6* 12.4* 13.0  HCT 40.5 38.7* 40.4  MCV 94.6 93.5 92.7  PLT 159 165 168   Basic Metabolic Panel: Recent Labs  Lab 11/24/23 1148 11/25/23 0254 11/26/23 0431  NA 139 136 138  K 4.2 4.0 3.9  CL 101 100 102  CO2 30 27 28   GLUCOSE 110* 84 88  BUN 16 17 18   CREATININE 0.82 0.71 0.78  CALCIUM  9.0 8.4* 8.7*   Liver Function Tests: Recent Labs  Lab 11/24/23 1148  AST 21  ALT 16  ALKPHOS 105  BILITOT 0.6  PROT 6.6  ALBUMIN 3.2*   CBG: No results for input(s): GLUCAP in the last 168 hours.  Discharge time spent: greater than 30 minutes.  Signed: Cresencio Fairly, MD [facility] 11/28/2023

## 2024-04-09 ENCOUNTER — Other Ambulatory Visit: Payer: Self-pay | Admitting: Specialist

## 2024-04-09 DIAGNOSIS — J849 Interstitial pulmonary disease, unspecified: Secondary | ICD-10-CM

## 2024-04-09 DIAGNOSIS — J8410 Pulmonary fibrosis, unspecified: Secondary | ICD-10-CM

## 2024-05-06 ENCOUNTER — Ambulatory Visit: Admission: RE | Admit: 2024-05-06 | Discharge: 2024-05-06 | Attending: Specialist | Admitting: Specialist

## 2024-05-06 DIAGNOSIS — J8410 Pulmonary fibrosis, unspecified: Secondary | ICD-10-CM | POA: Insufficient documentation

## 2024-05-06 DIAGNOSIS — J849 Interstitial pulmonary disease, unspecified: Secondary | ICD-10-CM | POA: Diagnosis present
